# Patient Record
Sex: Male | Born: 1980 | Race: Black or African American | Hispanic: No | Marital: Single | Smoking: Current every day smoker
Health system: Southern US, Community
[De-identification: ages and names within clinical notes are randomized; demographics above are authoritative.]

## PROBLEM LIST (undated history)

## (undated) DIAGNOSIS — M549 Dorsalgia, unspecified: Secondary | ICD-10-CM

## (undated) DIAGNOSIS — G43909 Migraine, unspecified, not intractable, without status migrainosus: Secondary | ICD-10-CM

## (undated) DIAGNOSIS — I10 Essential (primary) hypertension: Secondary | ICD-10-CM

---
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
## 2004-06-15 ENCOUNTER — Emergency Department (HOSPITAL_COMMUNITY): Admission: EM | Admit: 2004-06-15 | Discharge: 2004-06-15 | Payer: Self-pay | Admitting: Family Medicine

## 2005-05-19 ENCOUNTER — Emergency Department (HOSPITAL_COMMUNITY): Admission: EM | Admit: 2005-05-19 | Discharge: 2005-05-19 | Payer: Self-pay | Admitting: Family Medicine

## 2005-08-09 ENCOUNTER — Emergency Department (HOSPITAL_COMMUNITY): Admission: EM | Admit: 2005-08-09 | Discharge: 2005-08-09 | Payer: Self-pay | Admitting: Emergency Medicine

## 2005-11-27 ENCOUNTER — Emergency Department (HOSPITAL_COMMUNITY): Admission: EM | Admit: 2005-11-27 | Discharge: 2005-11-27 | Payer: Self-pay | Admitting: Family Medicine

## 2006-03-11 ENCOUNTER — Emergency Department (HOSPITAL_COMMUNITY): Admission: EM | Admit: 2006-03-11 | Discharge: 2006-03-11 | Payer: Self-pay | Admitting: Emergency Medicine

## 2006-03-12 ENCOUNTER — Emergency Department (HOSPITAL_COMMUNITY): Admission: EM | Admit: 2006-03-12 | Discharge: 2006-03-12 | Payer: Self-pay | Admitting: Emergency Medicine
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
## 2007-01-22 ENCOUNTER — Emergency Department (HOSPITAL_COMMUNITY): Admission: EM | Admit: 2007-01-22 | Discharge: 2007-01-22 | Payer: Self-pay | Admitting: Emergency Medicine
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
## 2008-08-08 ENCOUNTER — Emergency Department (HOSPITAL_COMMUNITY): Admission: EM | Admit: 2008-08-08 | Discharge: 2008-08-08 | Payer: Self-pay | Admitting: Family Medicine

## 2008-12-20 ENCOUNTER — Emergency Department (HOSPITAL_COMMUNITY): Admission: EM | Admit: 2008-12-20 | Discharge: 2008-12-20 | Payer: Self-pay | Admitting: Family Medicine

## 2010-10-28 NOTE — H&P (Signed)
NAME:  Tracy Tate, TILL NO.:  [id number]   MEDICAL RECORD NO.:  [id number]           PATIENT TYPE:   LOCATION:                               FACILITY:  [facility]   PHYSICIAN:  Virgie Dad, MD     DATE OF BIRTH:  11/14/1980   DATE OF ADMISSION:  DATE OF DISCHARGE:                              HISTORY & PHYSICAL   No primary care [name].   CHIEF COMPLAINT:  increasing shortness of breath.   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  He is admitted for  complaints of increasing shortness of breath.  The patient stated that  for the last 4 months, he has been increasingly short of breath to the  point that he has exertional dyspnea and has not been doing work full  time.  The shortness of breath has been going on for 4 months, and had  gotten worse in the last 4 weeks.  He has exertional dyspnea and  orthopnea, denies any chest pain.  He blames his shortness of breath for  too much cigarettes.  Apparently, he smoked 4 packs a day of cigarette  for several years since age 29, and he had cut down his cigarette  smoking to 1 pack a day for the last 6 months.  He denies any loss of  weight.  Denies hemoptysis.  Denies chest pain.  No fever.   PAST MEDICAL HISTORY:  He said that he has had no serious medical  condition except for a hernia repair.   FAMILY HISTORY:  There is cancer in the family.   SOCIAL HISTORY:  Smokes 4 packs a day, cut down to two packs per day 6  months ago.  Denies any drug abuse.   MEDICATIONS:  He does not any medications, not even aspirin.  One time  he did buy Robitussin for his cough and shortness of breath thinking  that it is chronic cold.   REVIEW OF SYSTEM:  Rare headaches; nasal congestion; chronic postnasal  drip; chronic cough, nonproductive; and sometimes wheezing, which he  blames to cigarettes and allergy.  No hemoptysis.  No chest pain,  shortness of breath increased in the past 4-6 months, worse in the last  4 weeks.  No chills.  No fever.  Takes Tums or Rolaids for dyspepsia.  No diarrhea.  No vomiting.  Denies any genitourinary complaints;  although, he believes he has a prostate problem due to nocturia that he  had noticed in the past 2 years.  No complains of musculoskeletal,  except for usual arthritis and arthralgia.   PHYSICAL EXAMINATION:  VITAL SIGNS:  Blood pressure 123/90, pulse rate  of 122, respiratory rate of 20, temperature 96.8.  SKIN:  No pallor.  Skin is dry.  Not diaphoretic.  HEENT:  Pupils are equal and reacting to light.  Trace postnasal drip  with erythema of the pharynx.  Poor oral hygiene.  NECK:  Neck veins are distended.  Carotids are quiet.  The thyroid gland  is normal in size.  No nodule.  CHEST:  Bilateral basal wet rales, not cleared on coughing.  No pleural  rub.  CARDIOVASCULAR:  110 per minute.  S1, S2 normal.  No murmur.  Has an S4  and a gallop.  No pericardial rub.  ABDOMEN:  Liver edge is palpable, and nontender.  There is no ascites.  Kidneys are not palpated.  Bowel sounds normal.  No guarding.  EXTREMITIES:  Lower extremities and upper extremities are normal.  No  pulmonary osteoarthropathy of the digits.  Lower extremities, 2+ pedal  edema.  No sign of DVT.  Pedal pulses are normal.  NEUROLOGIC:  Entirely normal, alert, awake, and conversant, although  gets short of breath when making long sentences.  Cranial nerves normal.  Spinal nerves normal.  Cerebral function normal.  Cerebellar function  normal including gait.  DTRs are normal.  No Babinski.  Motor and tone  normal with no paresis.   CURRENT MEDICATION:  None.  Takes Tums every now and then, and  Robitussin every now and then.   PERTINENT LABORATORY DATA:  White count is 7.9, hemoglobin 16.6,  hematocrit 48.3, and platelet count 157,000.  Liver function tests are  all normal including ALT and AST.  Sodium 135,  potassium 4, chloride 99,  BUN 17, creatinine 1.2, blood sugar is 418, and calcium 1.01.  Hemoglobin 17.7 and hematocrit 52.  Beta natriuretic peptide 1355.  Cardiac enzymes are not elevated.  Chest x-ray showed cardiomegaly with  congestive heart failure changes.   ASSESSMENT AND PLAN:  This is a 30 year old white male, a heavy smoker  with no medical care since 1990, presenting to ED with increasing  shortness of breath and is found to be in congestive heart failure.   1. Congestive heart failure, differential diagnosis ischemic      cardiomyopathy versus pulmonary hypertension from severe chronic      obstructive pulmonary disease.  Another consideration is diabetic      cardiomyopathy, untreated diabetes, and untreated hypertension.      Plan; cardiac echo, repeat EKG, monitor cardiac enzymes, start      diuretic, ACE blocker, beta blockers, and even hydralazine for the      congestive heart failure.  Check blood pressure before giving each      dose.  We will put the patient on 2-g sodium diet and cardiac echo      has been ordered in the morning.  2. Hyperglycemia of 408.  No known history of diabetes.  The patient      is asymptomatic.  No polyuria, polydipsia, or polyphagia.  We will      put the patient on sliding scale regular insulin with sugar check      a.c. and nightly, and will need diabetic teaching, and most likely      will need ideally Lantus insulin, plus or minus oral hypoglycemic.  3. Cigarette smoking.  Smoked 4 packs per day for 20 years, cut down      to 1 pack per day for the last 6 months.  Will order NicoDerm patch      14 mg/hr patch daily, and counseling regarding quitting smoking.  4. Chronic obstructive pulmonary disease.  Most likely, the patient      has chronic obstructive pulmonary disease, will need pulmonary      function tests and further assessment of chronic obstructive      pulmonary disease.  Once congestive heart failure has resolved, we  will start albuterol 2.5 mg q.6 h. nebulizer.  We will refrain from      starting any antibiotics, pending Gram stain and culture of the      sputum.  Prognosis is guarded.  Ethics, full code.      Virgie Dad, MD  Electronically Signed     EA/MEDQ  D:  12/29/2008  T:  12/30/2008  Job:  [id number]

## 2012-01-04 ENCOUNTER — Encounter (HOSPITAL_COMMUNITY): Payer: Self-pay

## 2012-01-04 ENCOUNTER — Emergency Department (INDEPENDENT_AMBULATORY_CARE_PROVIDER_SITE_OTHER)
Admission: EM | Admit: 2012-01-04 | Discharge: 2012-01-04 | Disposition: A | Discharge status: Home / Self Care | Payer: Self-pay | Source: Home / Self Care | Attending: Emergency Medicine | Admitting: Emergency Medicine

## 2012-01-04 DIAGNOSIS — K0401 Reversible pulpitis: Secondary | ICD-10-CM

## 2012-01-04 MED ORDER — HYDROCODONE-ACETAMINOPHEN 5-325 MG PO TABS: ORAL_TABLET | ORAL | Status: AC

## 2012-01-04 MED ORDER — PENICILLIN V POTASSIUM 500 MG PO TABS: 500.0000 mg | ORAL_TABLET | Freq: Four times a day (QID) | ORAL | Status: AC

## 2012-01-04 MED ORDER — DICLOFENAC SODIUM 75 MG PO TBEC: 75.0000 mg | DELAYED_RELEASE_TABLET | Freq: Two times a day (BID) | ORAL | Status: AC

## 2012-01-04 NOTE — ED Provider Notes (Signed)
Chief Complaint  Patient presents with  . Dental Problem    History of Present Illness:   Tracy Tate is a 31 year old male who has had a one-day history of pain in the right upper third molar. He states this is been hurting him off and on for months but she's gotten worse in the last 24 hours. He notes swelling of the gingiva but no purulent drainage and he feels like there is some swelling externally, but I can see any visible external swelling. The pain is worse with chewing or swallowing. He feels feverish and chilled. Is unable to open his mouth fully. He has some headache and some high pain on that side. No coughing, wheezing, or shortness of breath. No swelling of the neck.  Review of Systems:  Other than noted above, the patient denies any of the following symptoms: Systemic:  No fever, chills, sweats or weight loss. ENT:  No headache, ear ache, sore throat, nasal congestion, facial pain, or swelling. Lymphatic:  No adenopathy. Lungs:  No coughing, wheezing or shortness of breath.  PMFSH:  Past medical history, family history, social history, meds, and allergies were reviewed.  Physical Exam:   Vital signs:  BP 128/80  Pulse 72  Temp 99.5 F (37.5 C) (Oral)  Resp 16  SpO2 100% General:  Alert, oriented, in no distress. ENT:  TMs and canals normal.  Nasal mucosa normal. Mouth exam:  The right upper third molar is markedly decayed and there is swelling of the gingiva, pain to palpation, and erythema and swelling extending to the anterior tonsillar pillar on the right. The pharynx appears normal. She's otherwise in good repair. There is no swelling of the floor the mouth. Neck:  No swelling or adenopathy. The neck is tender to touch. Lungs:  Breath sounds clear and equal bilaterally.  No wheezes, rales or rhonchi. Heart:  Regular rhythm.  No gallops or murmers. Skin:  Clear, warm and dry.  Assessment:  The encounter diagnosis was Pulpitis.  Plan:   1.  The following meds were  prescribed:   New Prescriptions   DICLOFENAC (VOLTAREN) 75 MG EC TABLET    Take 1 tablet (75 mg total) by mouth 2 (two) times daily.   HYDROCODONE-ACETAMINOPHEN (NORCO/VICODIN) 5-325 MG PER TABLET    1 to 2 tabs every 4 to 6 hours as needed for pain.   PENICILLIN V POTASSIUM (VEETID) 500 MG TABLET    Take 1 tablet (500 mg total) by mouth 4 (four) times daily.   2.  The patient was instructed in symptomatic care and handouts were given. 3.  The patient was told to return if becoming worse in any way, if no better in 3 or 4 days, and given some red flag symptoms that would indicate earlier return, especially difficulty breathing. 4.  The patient was told to follow up with a dentist as soon as possible.    Reuben Likes, MD 01/04/12 [phone]

## 2012-01-04 NOTE — ED Notes (Signed)
C/o pain in right jaw due to wisdom tooth coming in ; asking for antibiotics and pain medication; NAD at present

## 2012-12-24 ENCOUNTER — Emergency Department (INDEPENDENT_AMBULATORY_CARE_PROVIDER_SITE_OTHER)
Admission: EM | Admit: 2012-12-24 | Discharge: 2012-12-24 | Disposition: A | Discharge status: Home / Self Care | Payer: Self-pay | Source: Home / Self Care | Attending: Emergency Medicine | Admitting: Emergency Medicine

## 2012-12-24 ENCOUNTER — Encounter (HOSPITAL_COMMUNITY): Payer: Self-pay | Admitting: Emergency Medicine

## 2012-12-24 DIAGNOSIS — K047 Periapical abscess without sinus: Secondary | ICD-10-CM

## 2012-12-24 MED ORDER — HYDROCODONE-ACETAMINOPHEN 5-325 MG PO TABS
2.0000 | ORAL_TABLET | Freq: Once | ORAL | Status: AC
  Administered 2012-12-24: 2 via ORAL

## 2012-12-24 MED ORDER — HYDROCODONE-ACETAMINOPHEN 5-325 MG PO TABS
ORAL_TABLET | ORAL | Status: AC
  Filled 2012-12-24: qty 2

## 2012-12-24 MED ORDER — PENICILLIN V POTASSIUM 500 MG PO TABS: 500.0000 mg | ORAL_TABLET | Freq: Three times a day (TID) | ORAL | Status: DC

## 2012-12-24 MED ORDER — NAPROXEN 500 MG PO TABS: 500.0000 mg | ORAL_TABLET | Freq: Two times a day (BID) | ORAL | Status: DC

## 2012-12-24 MED ORDER — METRONIDAZOLE 500 MG PO TABS: 500.0000 mg | ORAL_TABLET | Freq: Three times a day (TID) | ORAL | Status: DC

## 2012-12-24 MED ORDER — OXYCODONE-ACETAMINOPHEN 5-325 MG PO TABS: ORAL_TABLET | ORAL | Status: DC

## 2012-12-24 NOTE — ED Provider Notes (Signed)
Chief Complaint:  No chief complaint on file.   History of Present Illness:   Tracy Tate is a 32 year old male who has had a 5 to six-day history of pain in his right, upper wisdom tooth. It hurts when he chews. It also hurts to open his mouth, but he is able to open fully. He has no difficulty swallowing or breathing. He feels feverish and chilled. He's had some headache, facial pain, and right ear pain. He also has some pain in the right side of his neck but no swelling. No chest pain or shortness of breath.  Review of Systems:  Other than noted above, the patient denies any of the following symptoms: Systemic:  No fever, chills,  Or sweats. ENT:  No headache, ear ache, sore throat, nasal congestion, facial pain, or swelling. Lymphatic:  No adenopathy. Lungs:  No coughing, wheezing or shortness of breath.  PMFSH:  Past medical history, family history, social history, meds, and allergies were reviewed. I have seen him in the past for the same thing.  Physical Exam:   Vital signs:  BP 137/83  Pulse 72  Temp(Src) 98 F (36.7 C) (Oral)  Resp 16  SpO2 98% General:  Alert, oriented, in no distress. ENT:  TMs and canals normal.  Nasal mucosa normal. Mouth exam:  The right, upper wisdom tooth appears intact but is free painful to palpation. There is no swelling of the gingiva, no collection of pus. No swelling externally. Otherwise his teeth all appear to be intact with the exception of a left, lower, first molar which has a large cavity. There is no swelling of the floor the mouth, no swelling of the throat or tongue. The pharynx is clear and is widely patent. Neck:  No swelling or adenopathy. Lungs:  Breath sounds clear and equal bilaterally.  No wheezes, rales or rhonchi. Heart:  Regular rhythm.  No gallops or murmers. Skin:  Clear, warm and dry.   Course in Urgent Care Center:   Given Norco 5/325, 2 capsules for pain.  Assessment:  The encounter diagnosis was Dental abscess.  He will  need to have the wisdom tooth extracted. He can probably save the molar on the left side, but will probably require root canal and crown which will be very expensive.  Plan:   1.  The following meds were prescribed:   New Prescriptions   METRONIDAZOLE (FLAGYL) 500 MG TABLET    Take 1 tablet (500 mg total) by mouth 3 (three) times daily.   NAPROXEN (NAPROSYN) 500 MG TABLET    Take 1 tablet (500 mg total) by mouth 2 (two) times daily.   OXYCODONE-ACETAMINOPHEN (PERCOCET) 5-325 MG PER TABLET    1 to 2 tablets every 6 hours as needed for pain.   PENICILLIN V POTASSIUM (VEETID) 500 MG TABLET    Take 1 tablet (500 mg total) by mouth 3 (three) times daily.   2.  The patient was instructed in symptomatic care and handouts were given.  Suggested sleeping with head of bed elevated and hot salt water mouthwash. 3.  The patient was told to return if becoming worse in any way, if no better in 3 or 4 days, and given some red flag symptoms such as difficulty swallowing or breathing that would indicate earlier return, especially difficulty breathing. 4.  The patient was told to follow up with a dentist as soon as possible.    Reuben Likes, MD 12/24/12 [phone]

## 2012-12-24 NOTE — ED Notes (Signed)
Toothache as documented on information form by patient.  Onset of pain 6 days ago

## 2015-02-03 ENCOUNTER — Emergency Department (HOSPITAL_COMMUNITY)
Admission: EM | Admit: 2015-02-03 | Discharge: 2015-02-03 | Disposition: A | Discharge status: Home / Self Care | Payer: Self-pay | Attending: Emergency Medicine | Admitting: Emergency Medicine

## 2015-02-03 ENCOUNTER — Encounter (HOSPITAL_COMMUNITY): Payer: Self-pay | Admitting: *Deleted

## 2015-02-03 DIAGNOSIS — Z79899 Other long term (current) drug therapy: Secondary | ICD-10-CM | POA: Insufficient documentation

## 2015-02-03 DIAGNOSIS — J069 Acute upper respiratory infection, unspecified: Secondary | ICD-10-CM | POA: Insufficient documentation

## 2015-02-03 DIAGNOSIS — Z720 Tobacco use: Secondary | ICD-10-CM | POA: Insufficient documentation

## 2015-02-03 DIAGNOSIS — Z792 Long term (current) use of antibiotics: Secondary | ICD-10-CM | POA: Insufficient documentation

## 2015-02-03 DIAGNOSIS — H01001 Unspecified blepharitis right upper eyelid: Secondary | ICD-10-CM | POA: Insufficient documentation

## 2015-02-03 DIAGNOSIS — Z791 Long term (current) use of non-steroidal anti-inflammatories (NSAID): Secondary | ICD-10-CM | POA: Insufficient documentation

## 2015-02-03 MED ORDER — CYCLOBENZAPRINE HCL 10 MG PO TABS: 5.0000 mg | ORAL_TABLET | Freq: Two times a day (BID) | ORAL | Status: DC | PRN

## 2015-02-03 MED ORDER — BENZONATATE 100 MG PO CAPS: 100.0000 mg | ORAL_CAPSULE | Freq: Three times a day (TID) | ORAL | Status: DC

## 2015-02-03 MED ORDER — ERYTHROMYCIN 5 MG/GM OP OINT: TOPICAL_OINTMENT | OPHTHALMIC | Status: DC

## 2015-02-03 MED ORDER — AZITHROMYCIN 250 MG PO TABS: 250.0000 mg | ORAL_TABLET | Freq: Every day | ORAL | Status: DC

## 2015-02-03 NOTE — Discharge Instructions (Signed)
Blepharitis Blepharitis is redness, soreness, and swelling (inflammation) of one or both eyelids. It may be caused by an allergic reaction or a bacterial infection. Blepharitis may also be associated with reddened, scaly skin (seborrhea) of the scalp and eyebrows. While you sleep, eye discharge may cause your eyelashes to stick together. Your eyelids may itch, burn, swell, and may lose their lashes. These will grow back. Your eyes may become sensitive. Blepharitis may recur and need repeated treatment. If this is the case, you may require further evaluation by an eye specialist (ophthalmologist). HOME CARE INSTRUCTIONS   Keep your hands clean.  Use a clean towel each time you dry your eyelids. Do not use this towel to clean other areas. Do not share a towel or makeup with anyone.  Wash your eyelids with warm water or warm water mixed with a small amount of baby shampoo. Do this twice a day or as often as needed.  Wash your face and eyebrows at least once a day.  Use warm compresses 2 times a day for 10 minutes at a time, or as directed by your caregiver.  Apply antibiotic ointment as directed by your caregiver.  Avoid rubbing your eyes.  Avoid wearing makeup until you get better.  Follow up with your caregiver as directed. SEEK IMMEDIATE MEDICAL CARE IF:   You have pain, redness, or swelling that gets worse or spreads to other parts of your face.  Your vision changes, or you have pain when looking at lights or moving objects.  You have a fever.  Your symptoms continue for longer than 2 to 4 days or become worse. MAKE SURE YOU:  Upper Respiratory Infection, Adult An upper respiratory infection (URI) is also sometimes known as the common cold. The upper respiratory tract includes the nose, sinuses, throat, trachea, and bronchi. Bronchi are the airways leading to the lungs. Most people improve within 1 week, but symptoms can last up to 2 weeks. A residual cough may last even longer.    CAUSES Many different viruses can infect the tissues lining the upper respiratory tract. The tissues become irritated and inflamed and often become very moist. Mucus production is also common. A cold is contagious. You can easily spread the virus to others by oral contact. This includes kissing, sharing a glass, coughing, or sneezing. Touching your mouth or nose and then touching a surface, which is then touched by another person, can also spread the virus. SYMPTOMS  Symptoms typically develop 1 to 3 days after you come in contact with a cold virus. Symptoms vary from person to person. They may include:  Runny nose.  Sneezing.  Nasal congestion.  Sinus irritation.  Sore throat.  Loss of voice (laryngitis).  Cough.  Fatigue.  Muscle aches.  Loss of appetite.  Headache.  Low-grade fever. DIAGNOSIS  You might diagnose your own cold based on familiar symptoms, since most people get a cold 2 to 3 times a year. Your caregiver can confirm this based on your exam. Most importantly, your caregiver can check that your symptoms are not due to another disease such as strep throat, sinusitis, pneumonia, asthma, or epiglottitis. Blood tests, throat tests, and X-rays are not necessary to diagnose a common cold, but they may sometimes be helpful in excluding other more serious diseases. Your caregiver will decide if any further tests are required. RISKS AND COMPLICATIONS  You may be at risk for a more severe case of the common cold if you smoke cigarettes, have chronic heart disease (  such as heart failure) or lung disease (such as asthma), or if you have a weakened immune system. The very young and very old are also at risk for more serious infections. Bacterial sinusitis, middle ear infections, and bacterial pneumonia can complicate the common cold. The common cold can worsen asthma and chronic obstructive pulmonary disease (COPD). Sometimes, these complications can require emergency medical care  and may be life-threatening. PREVENTION  The best way to protect against getting a cold is to practice good hygiene. Avoid oral or hand contact with people with cold symptoms. Wash your hands often if contact occurs. There is no clear evidence that vitamin C, vitamin E, echinacea, or exercise reduces the chance of developing a cold. However, it is always recommended to get plenty of rest and practice good nutrition. TREATMENT  Treatment is directed at relieving symptoms. There is no cure. Antibiotics are not effective, because the infection is caused by a virus, not by bacteria. Treatment may include:  Increased fluid intake. Sports drinks offer valuable electrolytes, sugars, and fluids.  Breathing heated mist or steam (vaporizer or shower).  Eating chicken soup or other clear broths, and maintaining good nutrition.  Getting plenty of rest.  Using gargles or lozenges for comfort.  Controlling fevers with ibuprofen or acetaminophen as directed by your caregiver.  Increasing usage of your inhaler if you have asthma. Zinc gel and zinc lozenges, taken in the first 24 hours of the common cold, can shorten the duration and lessen the severity of symptoms. Pain medicines may help with fever, muscle aches, and throat pain. A variety of non-prescription medicines are available to treat congestion and runny nose. Your caregiver can make recommendations and may suggest nasal or lung inhalers for other symptoms.  HOME CARE INSTRUCTIONS   Only take over-the-counter or prescription medicines for pain, discomfort, or fever as directed by your caregiver.  Use a warm mist humidifier or inhale steam from a shower to increase air moisture. This may keep secretions moist and make it easier to breathe.  Drink enough water and fluids to keep your urine clear or pale yellow.  Rest as needed.  Return to work when your temperature has returned to normal or as your caregiver advises. You may need to stay home  longer to avoid infecting others. You can also use a face mask and careful hand washing to prevent spread of the virus. SEEK MEDICAL CARE IF:   After the first few days, you feel you are getting worse rather than better.  You need your caregiver's advice about medicines to control symptoms.  You develop chills, worsening shortness of breath, or brown or red sputum. These may be signs of pneumonia.  You develop yellow or brown nasal discharge or pain in the face, especially when you bend forward. These may be signs of sinusitis.  You develop a fever, swollen neck glands, pain with swallowing, or white areas in the back of your throat. These may be signs of strep throat. SEEK IMMEDIATE MEDICAL CARE IF:   You have a fever.  You develop severe or persistent headache, ear pain, sinus pain, or chest pain.  You develop wheezing, a prolonged cough, cough up blood, or have a change in your usual mucus (if you have chronic lung disease).  You develop sore muscles or a stiff neck. Document Released: 11/22/2000 Document Revised: 08/21/2011 Document Reviewed: 09/03/2013 [facility] Patient Information 2015 [place], [place]. This information is not intended to replace advice given to you by your health care provider.  Make sure you discuss any questions you have with your health care provider.   Understand these instructions.  Will watch your condition.  Will get help right away if you are not doing well or get worse. Document Released: 05/26/2000 Document Revised: 08/21/2011 Document Reviewed: 07/06/2010 [facility] Patient Information 2015 [place], [place]. This information is not intended to replace advice given to you by your health care provider. Make sure you discuss any questions you have with your health care provider.

## 2015-02-03 NOTE — ED Notes (Signed)
Pt presents to day because he back pain and cough that started on Monday . Pt also reports on Tuesday his Rt eye lid was swollen and a cough developed .

## 2015-02-03 NOTE — ED Provider Notes (Signed)
CSN: [id number]     Arrival date & time 02/03/15  0927 History  This chart was scribed for non-physician practitioner, Dorthula Matas, working with Gwyneth Sprout, MD by Freida Busman, ED Scribe. This patient was seen in room TR04C/TR04C and the patient's care was started at 10:07 AM.     Chief Complaint  Patient presents with  . Influenza  . Cough  . Eye Problem    The history is provided by the patient. No language interpreter was used.     HPI Comments:  Tracy Tate is a 34 y.o. male who presents to the Emergency Department with multiple complaints. He complains of generalized myalgia,and moderate back pain he describes as spasms. He reports associated swelling of his right eyelid and mild pain with movement of the eye, subjective fever and cough. His symptoms started ~3 days ago. He denies sick contacts. He has taken Robitussin without relief. He also denies vomiting, diarrhea, and abdominal pain.   PCP: No PCP Per Patient Pulse 66, temperature 98 F (36.7 C), temperature source Oral, resp. rate 16, height 6' (1.829 m), weight 225 lb (102.059 kg), SpO2 98 %.  The patient denies diaphoresis, fever, headache, weakness (general or focal), confusion, change of vision,  neck pain, dysphagia, aphagia, chest pain, shortness of breath,  abdominal pains, nausea, vomiting, diarrhea, lower extremity swelling, rash.  History reviewed. No pertinent past medical history. History reviewed. No pertinent past surgical history. History reviewed. No pertinent family history. Social History  Substance Use Topics  . Smoking status: Current Every Day Smoker -- 1.00 packs/day    Types: Cigarettes  . Smokeless tobacco: None  . Alcohol Use: Yes    Review of Systems  Constitutional: Positive for fever.  Eyes: Positive for pain (and mild swelling).  Respiratory: Positive for cough.   Gastrointestinal: Negative for vomiting, abdominal pain and diarrhea.  Musculoskeletal: Positive for myalgias  and back pain.   Allergies  Review of patient's allergies indicates no known allergies.  Home Medications   Prior to Admission medications   Medication Sig Start Date End Date Taking? Authorizing Provider  azithromycin (ZITHROMAX) 250 MG tablet Take 1 tablet (250 mg total) by mouth daily. Take first 2 tablets together, then 1 every day until finished. 02/03/15   Marsia Cino Neva Seat, PA-C  benzonatate (TESSALON) 100 MG capsule Take 1 capsule (100 mg total) by mouth every 8 (eight) hours. 02/03/15   Shray Hunley Neva Seat, PA-C  cyclobenzaprine (FLEXERIL) 10 MG tablet Take 0.5-1 tablets (5-10 mg total) by mouth 2 (two) times daily as needed for muscle spasms. 02/03/15   Marlon Pel, PA-C  erythromycin ophthalmic ointment Place a 1/2 inch ribbon of ointment into the lower eyelid BID for 7 days. 02/03/15   Doratha Mcswain Neva Seat, PA-C  metroNIDAZOLE (FLAGYL) 500 MG tablet Take 1 tablet (500 mg total) by mouth 3 (three) times daily. 12/24/12   Reuben Likes, MD  naproxen (NAPROSYN) 500 MG tablet Take 1 tablet (500 mg total) by mouth 2 (two) times daily. 12/24/12   Reuben Likes, MD  oxyCODONE-acetaminophen (PERCOCET) 5-325 MG per tablet 1 to 2 tablets every 6 hours as needed for pain. 12/24/12   Reuben Likes, MD  penicillin v potassium (VEETID) 500 MG tablet Take 1 tablet (500 mg total) by mouth 3 (three) times daily. 12/24/12   Reuben Likes, MD   Pulse 66  Temp(Src) 98 F (36.7 C) (Oral)  Resp 16  Ht 6' (1.829 m)  Wt 225 lb (102.059 kg)  BMI  30.51 kg/m2  SpO2 98% Physical Exam  Constitutional: He is oriented to person, place, and time. He appears well-developed and well-nourished. No distress.  HENT:  Head: Normocephalic and atraumatic.  Eyes: Conjunctivae and EOM are normal. Pupils are equal, round, and reactive to light. Right eye exhibits discharge (swelling to medial upper eyelid. mildly tender to palpation). Right conjunctiva is not injected. Left conjunctiva is not injected.  No pain with EOMI's.   Neck: Normal range of motion.  Cardiovascular: Normal rate.   Pulmonary/Chest: Effort normal and breath sounds normal. He has no decreased breath sounds. He has no wheezes. He has no rhonchi.  + coughing during exam  Musculoskeletal: Normal range of motion.       Thoracic back: Normal.       Lumbar back: Normal.  Neurological: He is alert and oriented to person, place, and time.  Skin: Skin is warm and dry.  Psychiatric: He has a normal mood and affect. His behavior is normal.  Nursing note and vitals reviewed.   ED Course  Procedures   DIAGNOSTIC STUDIES:  COORDINATION OF CARE:  10:14 AM Will discharge with prescription for antibiotic ointment for the eye, oral antibiotic for his URI and eyelid as well as tessalon pearls. Advised pt to use ibuprofen for his back pain and will rx muscle relaxers.  Discussed treatment plan with pt at bedside and pt agreed to plan. Pt is welling appearing, no s/sx of orbital cellulitis or deeper infection.   Labs Review Labs Reviewed - No data to display  Imaging Review No results found. I have personally reviewed and evaluated these images and lab results as part of my medical decision-making.   EKG Interpretation None      MDM   Final diagnoses:  Blepharitis of right upper eyelid  URI (upper respiratory infection)    Medications - No data to display  34 y.o.Tracy Tate's evaluation in the Emergency Department is complete. It has been determined that no acute conditions requiring further emergency intervention are present at this time. The patient/guardian have been advised of the diagnosis and plan. We have discussed signs and symptoms that warrant return to the ED, such as changes or worsening in symptoms.  Vital signs are stable at discharge. Filed Vitals:   02/03/15 0951  Pulse: 66  Temp: 98 F (36.7 C)  Resp: 16    Patient/guardian has voiced understanding and agreed to follow-up with the PCP or specialist.   I  personally performed the services described in this documentation, which was scribed in my presence. The recorded information has been reviewed and is accurate.    Marlon Pel, PA-C 02/03/15 1026  Gwyneth Sprout, MD 02/03/15 2228

## 2015-03-29 ENCOUNTER — Emergency Department (INDEPENDENT_AMBULATORY_CARE_PROVIDER_SITE_OTHER)
Admission: EM | Admit: 2015-03-29 | Discharge: 2015-03-29 | Disposition: A | Discharge status: Home / Self Care | Payer: Self-pay | Source: Home / Self Care | Attending: Family Medicine | Admitting: Family Medicine

## 2015-03-29 ENCOUNTER — Encounter (HOSPITAL_COMMUNITY): Payer: Self-pay | Admitting: *Deleted

## 2015-03-29 DIAGNOSIS — M545 Low back pain, unspecified: Secondary | ICD-10-CM

## 2015-03-29 MED ORDER — DICLOFENAC POTASSIUM 50 MG PO TABS: 50.0000 mg | ORAL_TABLET | Freq: Three times a day (TID) | ORAL | Status: DC

## 2015-03-29 MED ORDER — KETOROLAC TROMETHAMINE 30 MG/ML IJ SOLN
INTRAMUSCULAR | Status: AC
  Filled 2015-03-29: qty 1

## 2015-03-29 MED ORDER — CYCLOBENZAPRINE HCL 5 MG PO TABS: 5.0000 mg | ORAL_TABLET | Freq: Three times a day (TID) | ORAL | Status: DC | PRN

## 2015-03-29 MED ORDER — KETOROLAC TROMETHAMINE 30 MG/ML IJ SOLN
30.0000 mg | Freq: Once | INTRAMUSCULAR | Status: AC
  Administered 2015-03-29: 30 mg via INTRAMUSCULAR

## 2015-03-29 NOTE — ED Notes (Signed)
Pt  Reports       Symptoms   Of  Back  Pain  -   He  Injured  His  Back     Back  5  Months  Ago        And  It  Has  Been  Getting  Worse  Over  The  Last    Several  Days            He  States  The  Symptoms  Are  Not  releived  By OTC  meds

## 2015-03-29 NOTE — Discharge Instructions (Signed)
Use medicine and see orthopedist if further back problems

## 2015-03-29 NOTE — ED Provider Notes (Signed)
CSN: [id number]     Arrival date & time 03/29/15  1715 History   First MD Initiated Contact with Patient 03/29/15 1736     Chief Complaint  Patient presents with  . Back Pain   (Consider location/radiation/quality/duration/timing/severity/associated sxs/prior Treatment) Patient is a 34 y.o. male presenting with back pain. The history is provided by the patient.  Back Pain Location:  Lumbar spine Quality:  Shooting and stiffness Stiffness is present:  In the morning Radiates to:  Does not radiate Pain severity:  Moderate Onset quality:  Sudden Duration:  2 days Timing:  Intermittent Progression:  Waxing and waning Chronicity:  Chronic (problem for 8 months temporary but recurrent) Context: lifting heavy objects   Relieved by:  Bed rest Worsened by:  Bending   History reviewed. No pertinent past medical history. History reviewed. No pertinent past surgical history. History reviewed. No pertinent family history. Social History  Substance Use Topics  . Smoking status: Current Every Day Smoker -- 1.00 packs/day    Types: Cigarettes  . Smokeless tobacco: None  . Alcohol Use: Yes    Review of Systems  Constitutional: Negative.   Cardiovascular: Negative.   Genitourinary: Negative.   Musculoskeletal: Positive for back pain. Negative for joint swelling and gait problem.  Skin: Negative.   Neurological: Negative.   All other systems reviewed and are negative.   Allergies  Review of patient's allergies indicates no known allergies.  Home Medications   Prior to Admission medications   Medication Sig Start Date End Date Taking? Authorizing Provider  azithromycin (ZITHROMAX) 250 MG tablet Take 1 tablet (250 mg total) by mouth daily. Take first 2 tablets together, then 1 every day until finished. 02/03/15   Tiffany Neva Seat, PA-C  benzonatate (TESSALON) 100 MG capsule Take 1 capsule (100 mg total) by mouth every 8 (eight) hours. 02/03/15   Tiffany Neva Seat, PA-C  cyclobenzaprine  (FLEXERIL) 5 MG tablet Take 1 tablet (5 mg total) by mouth 3 (three) times daily as needed for muscle spasms. 03/29/15   Linna Hoff, MD  diclofenac (CATAFLAM) 50 MG tablet Take 1 tablet (50 mg total) by mouth 3 (three) times daily. For back pain 03/29/15   Linna Hoff, MD  erythromycin ophthalmic ointment Place a 1/2 inch ribbon of ointment into the lower eyelid BID for 7 days. 02/03/15   Tiffany Neva Seat, PA-C  metroNIDAZOLE (FLAGYL) 500 MG tablet Take 1 tablet (500 mg total) by mouth 3 (three) times daily. 12/24/12   Reuben Likes, MD  naproxen (NAPROSYN) 500 MG tablet Take 1 tablet (500 mg total) by mouth 2 (two) times daily. 12/24/12   Reuben Likes, MD  oxyCODONE-acetaminophen (PERCOCET) 5-325 MG per tablet 1 to 2 tablets every 6 hours as needed for pain. 12/24/12   Reuben Likes, MD  penicillin v potassium (VEETID) 500 MG tablet Take 1 tablet (500 mg total) by mouth 3 (three) times daily. 12/24/12   Reuben Likes, MD   Meds Ordered and Administered this Visit   Medications  ketorolac (TORADOL) 30 MG/ML injection 30 mg (30 mg Intramuscular Given 03/29/15 1751)    BP 143/84 mmHg  Pulse 96  Temp(Src) 98.7 F (37.1 C) (Oral)  Resp 16  SpO2 96% No data found.   Physical Exam  Constitutional: He is oriented to person, place, and time. He appears well-developed and well-nourished. No distress.  Musculoskeletal: He exhibits tenderness.       Lumbar back: He exhibits decreased range of motion, tenderness, pain and spasm.  He exhibits no swelling, no edema, no deformity and normal pulse.  Neurological: He is alert and oriented to person, place, and time.  Skin: Skin is warm and dry.  Nursing note and vitals reviewed.   ED Course  Procedures (including critical care time)  Labs Review Labs Reviewed - No data to display  Imaging Review No results found.   Visual Acuity Review  Right Eye Distance:   Left Eye Distance:   Bilateral Distance:    Right Eye Near:   Left Eye  Near:    Bilateral Near:         MDM   1. Bilateral low back pain without sciatica    rx for flexeril and diclofenac and ortho referral, oow note for 2d.    Linna HoffJames D Zenita Kister, MD 03/29/15 2122

## 2015-05-03 ENCOUNTER — Encounter (HOSPITAL_COMMUNITY): Payer: Self-pay | Admitting: Emergency Medicine

## 2015-05-03 ENCOUNTER — Emergency Department (HOSPITAL_COMMUNITY)
Admission: EM | Admit: 2015-05-03 | Discharge: 2015-05-03 | Disposition: A | Discharge status: Home / Self Care | Payer: Self-pay | Attending: Emergency Medicine | Admitting: Emergency Medicine

## 2015-05-03 DIAGNOSIS — M545 Low back pain, unspecified: Secondary | ICD-10-CM

## 2015-05-03 DIAGNOSIS — G8929 Other chronic pain: Secondary | ICD-10-CM | POA: Insufficient documentation

## 2015-05-03 DIAGNOSIS — F17210 Nicotine dependence, cigarettes, uncomplicated: Secondary | ICD-10-CM | POA: Insufficient documentation

## 2015-05-03 MED ORDER — BUPIVACAINE HCL 0.5 % IJ SOLN: 50.0000 mL | Freq: Once | INTRAMUSCULAR | Status: DC

## 2015-05-03 MED ORDER — BUPIVACAINE HCL (PF) 0.5 % IJ SOLN
20.0000 mL | Freq: Once | INTRAMUSCULAR | Status: AC
  Administered 2015-05-03: 20 mL
  Filled 2015-05-03: qty 20

## 2015-05-03 NOTE — ED Notes (Signed)
MD at the bedside  

## 2015-05-03 NOTE — Discharge Instructions (Signed)
Back Exercises The following exercises strengthen the muscles that help to support the back. They also help to keep the lower back flexible. Doing these exercises can help to prevent back pain or lessen existing pain. If you have back pain or discomfort, try doing these exercises 2-3 times each day or as told by your health care provider. When the pain goes away, do them once each day, but increase the number of times that you repeat the steps for each exercise (do more repetitions). If you do not have back pain or discomfort, do these exercises once each day or as told by your health care provider. EXERCISES Single Knee to Chest Repeat these steps 3-5 times for each leg:  Lie on your back on a firm bed or the floor with your legs extended.  Bring one knee to your chest. Your other leg should stay extended and in contact with the floor.  Hold your knee in place by grabbing your knee or thigh.  Pull on your knee until you feel a gentle stretch in your lower back.  Hold the stretch for 10-30 seconds.  Slowly release and straighten your leg. Pelvic Tilt Repeat these steps 5-10 times:  Lie on your back on a firm bed or the floor with your legs extended.  Bend your knees so they are pointing toward the ceiling and your feet are flat on the floor.  Tighten your lower abdominal muscles to press your lower back against the floor. This motion will tilt your pelvis so your tailbone points up toward the ceiling instead of pointing to your feet or the floor.  With gentle tension and even breathing, hold this position for 5-10 seconds. Cat-Cow Repeat these steps until your lower back becomes more flexible:  Get into a hands-and-knees position on a firm surface. Keep your hands under your shoulders, and keep your knees under your hips. You may place padding under your knees for comfort.  Let your head hang down, and point your tailbone toward the floor so your lower back becomes rounded like the  back of a cat.  Hold this position for 5 seconds.  Slowly lift your head and point your tailbone up toward the ceiling so your back forms a sagging arch like the back of a cow.  Hold this position for 5 seconds. Press-Ups Repeat these steps 5-10 times:  Lie on your abdomen (face-down) on the floor.  Place your palms near your head, about shoulder-width apart.  While you keep your back as relaxed as possible and keep your hips on the floor, slowly straighten your arms to raise the top half of your body and lift your shoulders. Do not use your back muscles to raise your upper torso. You may adjust the placement of your hands to make yourself more comfortable.  Hold this position for 5 seconds while you keep your back relaxed.  Slowly return to lying flat on the floor. Bridges Repeat these steps 10 times:  Lie on your back on a firm surface.  Bend your knees so they are pointing toward the ceiling and your feet are flat on the floor.  Tighten your buttocks muscles and lift your buttocks off of the floor until your waist is at almost the same height as your knees. You should feel the muscles working in your buttocks and the back of your thighs. If you do not feel these muscles, slide your feet 1-2 inches farther away from your buttocks.  Hold this position for 3-5  seconds.  Slowly lower your hips to the starting position, and allow your buttocks muscles to relax completely. If this exercise is too easy, try doing it with your arms crossed over your chest. Abdominal Crunches Repeat these steps 5-10 times:  Lie on your back on a firm bed or the floor with your legs extended.  Bend your knees so they are pointing toward the ceiling and your feet are flat on the floor.  Cross your arms over your chest.  Tip your chin slightly toward your chest without bending your neck.  Tighten your abdominal muscles and slowly raise your trunk (torso) high enough to lift your shoulder blades a  tiny bit off of the floor. Avoid raising your torso higher than that, because it can put too much stress on your low back and it does not help to strengthen your abdominal muscles.  Slowly return to your starting position. Back Lifts Repeat these steps 5-10 times: 1. Lie on your abdomen (face-down) with your arms at your sides, and rest your forehead on the floor. 2. Tighten the muscles in your legs and your buttocks. 3. Slowly lift your chest off of the floor while you keep your hips pressed to the floor. Keep the back of your head in line with the curve in your back. Your eyes should be looking at the floor. 4. Hold this position for 3-5 seconds. 5. Slowly return to your starting position. SEEK MEDICAL CARE IF:  Your back pain or discomfort gets much worse when you do an exercise.  Your back pain or discomfort does not lessen within 2 hours after you exercise. If you have any of these problems, stop doing these exercises right away. Do not do them again unless your health care provider says that you can. SEEK IMMEDIATE MEDICAL CARE IF:  You develop sudden, severe back pain. If this happens, stop doing the exercises right away. Do not do them again unless your health care provider says that you can.   This information is not intended to replace advice given to you by your health care provider. Make sure you discuss any questions you have with your health care provider.   Document Released: 07/06/2004 Document Revised: 02/17/2015 Document Reviewed: 07/23/2014 Elsevier Interactive Patient Education 2016 [employer] Back Injury Prevention Back injuries can be very painful. They can also be difficult to heal. After having one back injury, you are more likely to injure your back again. It is important to learn how to avoid injuring or re-injuring your back. The following tips can help you to prevent a back injury. WHAT SHOULD I KNOW ABOUT PHYSICAL FITNESS?  Exercise for 30 minutes per day  on most days of the week or as directed by your health care provider. Make sure to:  Do aerobic exercises, such as walking, jogging, biking, or swimming.  Do exercises that increase balance and strength, such as tai chi and yoga. These can decrease your risk of falling and injuring your back.  Do stretching exercises to help with flexibility.  Try to develop strong abdominal muscles. Your abdominal muscles provide a lot of the support that is needed by your back.  Maintain a healthy weight. This helps to decrease your risk of a back injury. WHAT SHOULD I KNOW ABOUT MY DIET?  Talk with your health care provider about your overall diet. Take supplements and vitamins only as directed by your health care provider.  Talk with your health care provider about how much calcium and vitamin   D you need each day. These nutrients help to prevent weakening of the bones (osteoporosis). Osteoporosis can cause broken (fractured) bones, which lead to back pain.  Include good sources of calcium in your diet, such as dairy products, green leafy vegetables, and products that have had calcium added to them (fortified).  Include good sources of vitamin D in your diet, such as milk and foods that are fortified with vitamin D. WHAT SHOULD I KNOW ABOUT MY POSTURE?  Sit up straight and stand up straight. Avoid leaning forward when you sit or hunching over when you stand.  Choose chairs that have good low-back (lumbar) support.  If you work at a desk, sit close to it so you do not need to lean over. Keep your chin tucked in. Keep your neck drawn back, and keep your elbows bent at a right angle. Your arms should look like the letter "L."  Sit high and close to the steering wheel when you drive. Add a lumbar support to your car seat, if needed.  Avoid sitting or standing in one position for very long. Take breaks to get up, stretch, and walk around at least one time every hour. Take breaks every hour if you are  driving for long periods of time.  Sleep on your side with your knees slightly bent, or sleep on your back with a pillow under your knees. Do not lie on the front of your body to sleep. WHAT SHOULD I KNOW ABOUT LIFTING, TWISTING, AND REACHING? Lifting and Heavy Lifting  Avoid heavy lifting, especially repetitive heavy lifting. If you must do heavy lifting:  Stretch before lifting.  Work slowly.  Rest between lifts.  Use a tool such as a cart or a dolly to move objects if one is available.  Make several small trips instead of carrying one heavy load.  Ask for help when you need it, especially when moving big objects.  Follow these steps when lifting:  Stand with your feet shoulder-width apart.  Get as close to the object as you can. Do not try to pick up a heavy object that is far from your body.  Use handles or lifting straps if they are available.  Bend at your knees. Squat down, but keep your heels off the floor.  Keep your shoulders pulled back, your chin tucked in, and your back straight.  Lift the object slowly while you tighten the muscles in your legs, abdomen, and buttocks. Keep the object as close to the center of your body as possible.  Follow these steps when putting down a heavy load:  Stand with your feet shoulder-width apart.  Lower the object slowly while you tighten the muscles in your legs, abdomen, and buttocks. Keep the object as close to the center of your body as possible.  Keep your shoulders pulled back, your chin tucked in, and your back straight.  Bend at your knees. Squat down, but keep your heels off the floor.  Use handles or lifting straps if they are available. Twisting and Reaching  Avoid lifting heavy objects above your waist.  Do not twist at your waist while you are lifting or carrying a load. If you need to turn, move your feet.  Do not bend over without bending at your knees.  Avoid reaching over your head, across a table, or  for an object on a high surface. WHAT ARE SOME OTHER TIPS?  Avoid wet floors and icy ground. Keep sidewalks clear of ice to prevent falls.    falls.  Do not sleep on a mattress that is too soft or too hard.  Keep items that are used frequently within easy reach.  Put heavier objects on shelves at waist level, and put lighter objects on lower or higher shelves.  Find ways to decrease your stress, such as exercise, massage, or relaxation techniques. Stress can build up in your muscles. Tense muscles are more vulnerable to injury.  Talk with your health care provider if you feel anxious or depressed. These conditions can make back pain worse.  Wear flat heel shoes with cushioned soles.  Avoid sudden movements.  Use both shoulder straps when carrying a backpack.  Do not use any tobacco products, including cigarettes, chewing tobacco, or electronic cigarettes. If you need help quitting, ask your health care provider.   This information is not intended to replace advice given to you by your health care provider. Make sure you discuss any questions you have with your health care provider.   Document Released: 07/06/2004 Document Revised: 10/13/2014 Document Reviewed: 06/02/2014 Elsevier Interactive Patient Education [employer].

## 2015-05-03 NOTE — ED Notes (Signed)
Per patient has injured back a few months ago.  Has been here several times for the same thing.  States I just need a couple days off work to rest my back.

## 2015-05-03 NOTE — ED Provider Notes (Signed)
CSN: [id number]     Arrival date & time 05/03/15  [id number] History   First MD Initiated Contact with Patient 05/03/15 [phone]     Chief Complaint  Patient presents with  . Back Pain     (Consider location/radiation/quality/duration/timing/severity/associated sxs/prior Treatment) Patient is a 34 y.o. male presenting with back pain. The history is provided by the patient.  Back Pain Location:  Lumbar spine Quality:  Aching Radiates to:  Does not radiate Pain severity:  Moderate Pain is:  Same all the time Onset quality:  Gradual Duration:  6 months Timing:  Intermittent Progression:  Waxing and waning Chronicity:  Chronic Context: occupational injury (at work)   Relieved by:  Nothing Worsened by:  Nothing tried Ineffective treatments:  None tried Associated symptoms: no bladder incontinence, no bowel incontinence, no fever and no weight loss     History reviewed. No pertinent past medical history. History reviewed. No pertinent past surgical history. No family history on file. Social History  Substance Use Topics  . Smoking status: Current Every Day Smoker -- 1.00 packs/day    Types: Cigarettes  . Smokeless tobacco: None  . Alcohol Use: Yes    Review of Systems  Constitutional: Negative for fever and weight loss.  Gastrointestinal: Negative for bowel incontinence.  Genitourinary: Negative for bladder incontinence.  Musculoskeletal: Positive for back pain.  All other systems reviewed and are negative.     Allergies  Review of patient's allergies indicates no known allergies.  Home Medications   Prior to Admission medications   Medication Sig Start Date End Date Taking? Authorizing Provider  azithromycin (ZITHROMAX) 250 MG tablet Take 1 tablet (250 mg total) by mouth daily. Take first 2 tablets together, then 1 every day until finished. Patient not taking: Reported on 05/03/2015 02/03/15   Marlon Peliffany Greene, PA-C  benzonatate (TESSALON) 100 MG capsule Take 1 capsule (100  mg total) by mouth every 8 (eight) hours. Patient not taking: Reported on 05/03/2015 02/03/15   Marlon Peliffany Greene, PA-C  cyclobenzaprine (FLEXERIL) 5 MG tablet Take 1 tablet (5 mg total) by mouth 3 (three) times daily as needed for muscle spasms. Patient not taking: Reported on 05/03/2015 03/29/15   Linna HoffJames D Kindl, MD  diclofenac (CATAFLAM) 50 MG tablet Take 1 tablet (50 mg total) by mouth 3 (three) times daily. For back pain Patient not taking: Reported on 05/03/2015 03/29/15   Linna HoffJames D Kindl, MD  erythromycin ophthalmic ointment Place a 1/2 inch ribbon of ointment into the lower eyelid BID for 7 days. Patient not taking: Reported on 05/03/2015 02/03/15   Marlon Peliffany Greene, PA-C  metroNIDAZOLE (FLAGYL) 500 MG tablet Take 1 tablet (500 mg total) by mouth 3 (three) times daily. Patient not taking: Reported on 05/03/2015 12/24/12   Reuben Likesavid C Keller, MD  naproxen (NAPROSYN) 500 MG tablet Take 1 tablet (500 mg total) by mouth 2 (two) times daily. Patient not taking: Reported on 05/03/2015 12/24/12   Reuben Likesavid C Keller, MD  oxyCODONE-acetaminophen (PERCOCET) 5-325 MG per tablet 1 to 2 tablets every 6 hours as needed for pain. Patient not taking: Reported on 05/03/2015 12/24/12   Reuben Likesavid C Keller, MD  penicillin v potassium (VEETID) 500 MG tablet Take 1 tablet (500 mg total) by mouth 3 (three) times daily. Patient not taking: Reported on 05/03/2015 12/24/12   Reuben Likesavid C Keller, MD   BP 195/114 mmHg  Pulse 78  Temp(Src) 98.2 F (36.8 C) (Oral)  Resp 16  Ht 6\' 1"  (1.854 m)  Wt 230 lb (104.327 kg)  BMI 30.35 kg/m2  SpO2 95% Physical Exam  Constitutional: He is oriented to person, place, and time. He appears well-developed and well-nourished. No distress.  HENT:  Head: Normocephalic and atraumatic.  Eyes: Conjunctivae are normal.  Neck: Neck supple. No tracheal deviation present.  Cardiovascular: Normal rate and regular rhythm.   Pulmonary/Chest: Effort normal. No respiratory distress.  Abdominal: Soft. He exhibits  no distension.  Musculoskeletal:       Lumbar back: He exhibits tenderness (overlying bilateral paraspinal musculature).  Neurological: He is alert and oriented to person, place, and time.  Skin: Skin is warm and dry.  Psychiatric: He has a normal mood and affect.    ED Course  Procedures (including critical care time)  Procedure Note: Trigger Point Injection for Myofascial pain  Performed by Dr. Clydene Pugh Indication: muscle/myofascial pain Muscle body and tendon sheath of the right paraspinal muscle(s) were injected with 0.5% bupivacaine under sterile technique for release of muscle spasm/pain. Patient tolerated well with immediate improvement of symptoms and no immediate complications following procedure.  CPT Code:   1 or 2 muscle bodies: 20552 3 or more: 16109   Labs Review Labs Reviewed - No data to display  Imaging Review No results found. I have personally reviewed and evaluated these images and lab results as part of my medical decision-making.   EKG Interpretation None      MDM   Final diagnoses:  Bilateral low back pain without sciatica    34 y.o. male presents with chronic bilateral lower back pain over the last 6 months. He has multiple ED visits with similar complaints. He is concerned that each time he comes here only gets a few days off of work and needs a longer time out. Over the same course of time he has made no attempt to establish a primary care provider. No red flag symptoms of fever, weight loss, saddle anesthesia, weakness fecal/urinary incontinence or urinary retention. Offered trigger point injections for bilateral lower back muscle spasm, was able to tolerate 1 but not both sides. Patient needs to establish primary care in the area and was provided contact information to do so.    Lyndal Pulley, MD 05/03/15 [phone]

## 2015-10-18 ENCOUNTER — Ambulatory Visit (HOSPITAL_COMMUNITY)
Admission: EM | Admit: 2015-10-18 | Discharge: 2015-10-18 | Disposition: A | Discharge status: Home / Self Care | Payer: Self-pay | Attending: Emergency Medicine | Admitting: Emergency Medicine

## 2015-10-18 ENCOUNTER — Encounter (HOSPITAL_COMMUNITY): Payer: Self-pay | Admitting: Emergency Medicine

## 2015-10-18 DIAGNOSIS — T148 Other injury of unspecified body region: Secondary | ICD-10-CM

## 2015-10-18 DIAGNOSIS — T148XXA Other injury of unspecified body region, initial encounter: Secondary | ICD-10-CM

## 2015-10-18 DIAGNOSIS — M545 Low back pain, unspecified: Secondary | ICD-10-CM

## 2015-10-18 HISTORY — DX: Dorsalgia, unspecified: M54.9

## 2015-10-18 MED ORDER — DICLOFENAC POTASSIUM 50 MG PO TABS: 50.0000 mg | ORAL_TABLET | Freq: Three times a day (TID) | ORAL | Status: DC

## 2015-10-18 MED ORDER — METHOCARBAMOL 500 MG PO TABS: 500.0000 mg | ORAL_TABLET | Freq: Two times a day (BID) | ORAL | Status: DC

## 2015-10-18 NOTE — ED Notes (Signed)
Complains of lower back pain that started yesterday evening. Patient also reports pain episode last week.  Patient reports taking "friends" muscle relaxer last week.   History of back issues.  Encouraged finding pcp.  "I've been coming here since I was 20"

## 2015-10-18 NOTE — ED Provider Notes (Signed)
CSN: [id number]     Arrival date & time 10/18/15  1406 History   First MD Initiated Contact with Patient 10/18/15 1612     Chief Complaint  Patient presents with  . Back Pain   (Consider location/radiation/quality/duration/timing/severity/associated sxs/prior Treatment) HPI Comments: 35 year old male complaining of chronic intermittent pain in the lower back. 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 This keeps his back feeling tight and stiff most of the time. Last week he stated that the pain had increased and is not getting any better. Pain is located to the lower parathoracic and paralumbar musculature. It is worse with movement, twisting, bending, lifting. Denies spinal pain. Denies distal focal paresthesias or weakness. Low back pain has been a recurrent problem and has been seen in the urgent care and emergency Department for this in the past.   Past Medical History  Diagnosis Date  . Back pain    History reviewed. No pertinent past surgical history. No family history on file. Social History  Substance Use Topics  . Smoking status: Current Every Day Smoker -- 1.00 packs/day    Types: Cigarettes  . Smokeless tobacco: None  . Alcohol Use: Yes    Review of Systems  Constitutional: Positive for activity change.  HENT: Negative.   Respiratory: Negative.   Gastrointestinal: Negative.   Genitourinary: Negative.   Musculoskeletal: Positive for myalgias and back pain. Negative for neck pain.       As per HPI  Skin: Negative.   Neurological: Negative for dizziness, weakness, numbness and headaches.    Allergies  Review of patient's allergies indicates no known allergies.  Home Medications   Prior to Admission medications   Medication Sig Start Date End Date Taking? Authorizing Provider  diclofenac (CATAFLAM) 50 MG tablet Take 1 tablet (50 mg total) by mouth 3 (three) times daily.  One tablet TID with food prn pain. 10/18/15   Hayden Rasmussen, NP  methocarbamol (ROBAXIN) 500 MG tablet Take 1 tablet (500 mg total) by mouth 2 (two) times daily. 10/18/15   Hayden Rasmussen, NP   Meds Ordered and Administered this Visit  Medications - No data to display  BP 161/99 mmHg  Pulse 68  Temp(Src) 98.9 F (37.2 C) (Oral)  Resp 12  SpO2 100% No data found.   Physical Exam  Constitutional: He is oriented to person, place, and time. He appears well-developed and well-nourished. No distress.  HENT:  Head: Normocephalic and atraumatic.  Eyes: EOM are normal. Left eye exhibits no discharge.  Neck: Normal range of motion. Neck supple.  Cardiovascular: Normal rate.   Pulmonary/Chest: Effort normal.  Musculoskeletal: Normal range of motion. He exhibits tenderness. He exhibits no edema.  Tenderness to the lower para thoracic and paralumbar musculature. No deformity or discoloration. No spinal tenderness, deformity. Patient is able to get onto and off the exam table without assistance. Lower extremity strength is 5 over 5 bilaterally.  Neurological: He is alert and oriented to person, place, and time. No cranial nerve deficit.  Skin: Skin is warm and dry.  Psychiatric: He has a normal mood and affect.  Nursing note and vitals reviewed.   ED Course  Procedures (including critical care time)  Labs Review Labs Reviewed - No data to display  Imaging Review No results found.   Visual Acuity Review  Right Eye Distance:   Left Eye Distance:   Bilateral Distance:  Right Eye Near:   Left Eye Near:    Bilateral Near:         MDM   1. Bilateral low back pain without sciatica   2. Muscle strain    Meds ordered this encounter  Medications  . diclofenac (CATAFLAM) 50 MG tablet    Sig: Take 1 tablet (50 mg total) by mouth 3 (three) times daily. One tablet TID with food prn pain.    Dispense:  21 tablet    Refill:  0    Order Specific Question:  Supervising Provider    Answer:   Charm RingsHONIG, ERIN J [id number]]  . methocarbamol (ROBAXIN) 500 MG tablet    Sig: Take 1 tablet (500 mg total) by mouth 2 (two) times daily.    Dispense:  20 tablet    Refill:  0    Order Specific Question:  Supervising Provider    Answer:  Charm RingsHONIG, ERIN J [[id number]]   Heat and stretches several times a day Use Thermacare heat wraps or heating pad. Limit heavy lifting. May need PT. F/U with a PCP    Hayden Rasmussenavid Juel Bellerose, NP 10/18/15 [phone]

## 2015-10-18 NOTE — Discharge Instructions (Signed)
Back Pain, Adult Heat and stretches several times a day Use Thermacare heat wraps or heating pad. Back pain is very common in adults.The cause of back pain is rarely dangerous and the pain often gets better over time.The cause of your back pain may not be known. Some common causes of back pain include:  Strain of the muscles or ligaments supporting the spine.  Wear and tear (degeneration) of the spinal disks.  Arthritis.  Direct injury to the back. For many people, back pain may return. Since back pain is rarely dangerous, most people can learn to manage this condition on their own. HOME CARE INSTRUCTIONS Watch your back pain for any changes. The following actions may help to lessen any discomfort you are feeling:  Remain active. It is stressful on your back to sit or stand in one place for long periods of time. Do not sit, drive, or stand in one place for more than 30 minutes at a time. Take short walks on even surfaces as soon as you are able.Try to increase the length of time you walk each day.  Exercise regularly as directed by your health care provider. Exercise helps your back heal faster. It also helps avoid future injury by keeping your muscles strong and flexible.  Do not stay in bed.Resting more than 1-2 days can delay your recovery.  Pay attention to your body when you bend and lift. The most comfortable positions are those that put less stress on your recovering back. Always use proper lifting techniques, including:  Bending your knees.  Keeping the load close to your body.  Avoiding twisting.  Find a comfortable position to sleep. Use a firm mattress and lie on your side with your knees slightly bent. If you lie on your back, put a pillow under your knees.  Avoid feeling anxious or stressed.Stress increases muscle tension and can worsen back pain.It is important to recognize when you are anxious or stressed and learn ways to manage it, such as with exercise.  Take  medicines only as directed by your health care provider. Over-the-counter medicines to reduce pain and inflammation are often the most helpful.Your health care provider may prescribe muscle relaxant drugs.These medicines help dull your pain so you can more quickly return to your normal activities and healthy exercise.  Apply ice to the injured area:  Put ice in a plastic bag.  Place a towel between your skin and the bag.  Leave the ice on for 20 minutes, 2-3 times a day for the first 2-3 days. After that, ice and heat may be alternated to reduce pain and spasms.  Maintain a healthy weight. Excess weight puts extra stress on your back and makes it difficult to maintain good posture. SEEK MEDICAL CARE IF:  You have pain that is not relieved with rest or medicine.  You have increasing pain going down into the legs or buttocks.  You have pain that does not improve in one week.  You have night pain.  You lose weight.  You have a fever or chills. SEEK IMMEDIATE MEDICAL CARE IF:   You develop new bowel or bladder control problems.  You have unusual weakness or numbness in your arms or legs.  You develop nausea or vomiting.  You develop abdominal pain.  You feel faint.   This information is not intended to replace advice given to you by your health care provider. Make sure you discuss any questions you have with your health care provider.   Document  Released: 05/29/2005 Document Revised: 06/19/2014 Document Reviewed: 09/30/2013 Elsevier Interactive Patient Education [employer].

## 2017-04-19 ENCOUNTER — Ambulatory Visit (HOSPITAL_COMMUNITY)
Admission: EM | Admit: 2017-04-19 | Discharge: 2017-04-19 | Disposition: A | Discharge status: Home / Self Care | Payer: Self-pay | Attending: Family Medicine | Admitting: Family Medicine

## 2017-04-19 ENCOUNTER — Encounter (HOSPITAL_COMMUNITY): Payer: Self-pay | Admitting: Emergency Medicine

## 2017-04-19 DIAGNOSIS — K0889 Other specified disorders of teeth and supporting structures: Secondary | ICD-10-CM

## 2017-04-19 MED ORDER — PENICILLIN V POTASSIUM 500 MG PO TABS: 500.0000 mg | ORAL_TABLET | Freq: Four times a day (QID) | ORAL | 0 refills | Status: AC

## 2017-04-19 MED ORDER — HYDROCODONE-ACETAMINOPHEN 5-325 MG PO TABS: 1.0000 | ORAL_TABLET | Freq: Four times a day (QID) | ORAL | 0 refills | Status: AC | PRN

## 2017-04-19 NOTE — ED Triage Notes (Signed)
PT has upper left dental pain. Face is swollen.

## 2017-04-19 NOTE — ED Provider Notes (Addendum)
[facility]    CSN: [id number] Arrival date & time: 04/19/17  1701     History   Chief Complaint Chief Complaint  Patient presents with  . Dental Pain    HPI Tracy Tate is a 36 y.o. male.   The history is provided by the patient.  Dental Pain  Location:  Upper Upper teeth location:  14/LU 1st molar Quality:  Throbbing Severity:  Severe Onset quality:  Gradual Duration:  2 weeks Timing:  Constant Progression:  Worsening Chronicity:  New Context: dental caries and dental fracture   Relieved by:  Nothing Ineffective treatments:  Acetaminophen   Past Medical History:  Diagnosis Date  . Back pain     There are no active problems to display for this patient.   History reviewed. No pertinent surgical history.     Home Medications    Prior to Admission medications   Medication Sig Start Date End Date Taking? Authorizing Provider  HYDROcodone-acetaminophen (NORCO/VICODIN) 5-325 MG tablet Take 1 tablet every 6 (six) hours as needed for up to 5 days by mouth. 04/19/17 04/24/17  Lucia EstelleZheng, Marcelyn Ruppe, NP  penicillin v potassium (VEETID) 500 MG tablet Take 1 tablet (500 mg total) 4 (four) times daily for 7 days by mouth. 04/19/17 04/26/17  Lucia EstelleZheng, Emery Binz, NP    Family History No family history on file.  Social History Social History   Tobacco Use  . Smoking status: Current Every Day Smoker    Packs/day: 1.00    Types: Cigarettes  Substance Use Topics  . Alcohol use: Yes  . Drug use: No     Allergies   Patient has no known allergies.   Review of Systems Review of Systems  Constitutional:       See HPI     Physical Exam Triage Vital Signs ED Triage Vitals  Enc Vitals Group     BP 04/19/17 1719 (!) 175/116     Pulse Rate 04/19/17 1719 84     Resp 04/19/17 1719 16     Temp 04/19/17 1719 98.6 F (37 C)     Temp src --      SpO2 04/19/17 1719 100 %     Weight 04/19/17 1719 222 lb (100.7 kg)     Height 04/19/17 1719 6\' 1"  (1.854 m)     Head  Circumference --      Peak Flow --      Pain Score 04/19/17 1720 8     Pain Loc --      Pain Edu? --      Excl. in GC? --    No data found.  Updated Vital Signs BP (!) 175/116   Pulse 84   Temp 98.6 F (37 C)   Resp 16   Ht 6\' 1"  (1.854 m)   Wt 222 lb (100.7 kg)   SpO2 100%   BMI 29.29 kg/m   Visual Acuity Right Eye Distance:   Left Eye Distance:   Bilateral Distance:    Right Eye Near:   Left Eye Near:    Bilateral Near:     Physical Exam  Constitutional: He is oriented to person, place, and time. He appears well-developed and well-nourished.  HENT:  Mouth/Throat:    Cardiovascular: Normal rate, regular rhythm and normal heart sounds.  Pulmonary/Chest: Effort normal and breath sounds normal.  Neurological: He is alert and oriented to person, place, and time.  Nursing note and vitals reviewed.    UC Treatments / Results  Labs (all labs ordered are listed, but only abnormal results are displayed) Labs Reviewed - No data to display  EKG  EKG Interpretation None       Radiology No results found.  Procedures Procedures (including critical care time)  Medications Ordered in UC Medications - No data to display   Initial Impression / Assessment and Plan / UC Course  I have reviewed the triage vital signs and the nursing notes.  Pertinent labs & imaging results that were available during my care of the patient were reviewed by me and considered in my medical decision making (see chart for details).  Final Clinical Impressions(s) / UC Diagnoses   Final diagnoses:  Toothache   Prescriptions given (see below). Reviewed directions for usage and side effects. Patient states understanding and will call with questions or problems. Patient instructed to f/u with a dentis; patient will do so. Discharge instruction given.  ED Discharge Orders        Ordered    penicillin v potassium (VEETID) 500 MG tablet  4 times daily     04/19/17 1816     HYDROcodone-acetaminophen (NORCO/VICODIN) 5-325 MG tablet  Every 6 hours PRN     04/19/17 1816       Controlled Substance Prescriptions [place] Controlled Substance Registry consulted? Yes       Lucia EstelleZheng, Ciarah Peace, NP 04/19/17 1929

## 2017-10-31 ENCOUNTER — Encounter (HOSPITAL_COMMUNITY): Payer: Self-pay | Admitting: Emergency Medicine

## 2017-10-31 ENCOUNTER — Ambulatory Visit (HOSPITAL_COMMUNITY)
Admission: EM | Admit: 2017-10-31 | Discharge: 2017-10-31 | Disposition: A | Discharge status: Home / Self Care | Payer: Self-pay | Attending: Family Medicine | Admitting: Family Medicine

## 2017-10-31 DIAGNOSIS — L255 Unspecified contact dermatitis due to plants, except food: Secondary | ICD-10-CM

## 2017-10-31 MED ORDER — PREDNISONE 10 MG (48) PO TBPK: ORAL_TABLET | ORAL | 0 refills | Status: DC

## 2017-10-31 NOTE — ED Triage Notes (Signed)
Pt has poison ivy rash on L arm, rash is extensive, L arm is swollen and red.

## 2017-10-31 NOTE — ED Provider Notes (Signed)
  [facility]   [id number] 10/31/17 Arrival Time: 1550  ASSESSMENT & PLAN:  1. Rhus dermatitis     Meds ordered this encounter  Medications  . predniSONE (STERAPRED UNI-PAK 48 TAB) 10 MG (48) TBPK tablet    Sig: Take as directed.    Dispense:  48 tablet    Refill:  0   OTC Benadryl if needed. Reviewed expectations re: course of current medical issues. Questions answered. Outlined signs and symptoms indicating need for more acute intervention. Patient verbalized understanding. After Visit Summary given.   SUBJECTIVE:  Tracy Tate is a 37 y.o. male who presents with a skin complaint.   Location: L arm Onset: gradual Duration: a few days Pruritic? Yes Painful? No Progression: stable  Drainage? No  Known trigger? Questions poison ivy. New soaps/lotions/topicals/detergents? No Environmental exposures or allergies? grasses Contacts with similar? No Recent travel? No  Other associated symptoms: none Therapies tried thus far: none Denies fever. No specific aggravating or alleviating factors reported.  ROS: As per HPI.  OBJECTIVE: Vitals:   10/31/17 1607  BP: (!) 145/86  Pulse: 95  Resp: 18  Temp: 98.8 F (37.1 C)  SpO2: 99%    General appearance: alert; no distress Lungs: clear to auscultation bilaterally Heart: regular rate and rhythm Extremities: no edema Skin: warm and dry; areas of linear papules and vesicles with surrounding erythema over L forearm; no signs of infection Psychological: alert and cooperative; normal mood and affect  No Known Allergies  Past Medical History:  Diagnosis Date  . Back pain    Social History   Socioeconomic History  . Marital status: Single    Spouse name: Not on file  . Number of children: Not on file  . Years of education: Not on file  . Highest education level: Not on file  Occupational History  . Not on file  Social Needs  . Financial resource strain: Not on file  . Food insecurity:    Worry:  Not on file    Inability: Not on file  . Transportation needs:    Medical: Not on file    Non-medical: Not on file  Tobacco Use  . Smoking status: Current Every Day Smoker    Packs/day: 1.00    Types: Cigarettes  Substance and Sexual Activity  . Alcohol use: Yes  . Drug use: No  . Sexual activity: Not on file  Lifestyle  . Physical activity:    Days per week: Not on file    Minutes per session: Not on file  . Stress: Not on file  Relationships  . Social connections:    Talks on phone: Not on file    Gets together: Not on file    Attends religious service: Not on file    Active member of club or organization: Not on file    Attends meetings of clubs or organizations: Not on file    Relationship status: Not on file  . Intimate partner violence:    Fear of current or ex partner: Not on file    Emotionally abused: Not on file    Physically abused: Not on file    Forced sexual activity: Not on file  Other Topics Concern  . Not on file  Social History Narrative  . Not on file   No family history on file. History reviewed. No pertinent surgical history.   Mardella Layman, MD 11/07/17 [phone]

## 2019-02-07 ENCOUNTER — Ambulatory Visit (INDEPENDENT_AMBULATORY_CARE_PROVIDER_SITE_OTHER): Payer: Self-pay

## 2019-02-07 ENCOUNTER — Ambulatory Visit (HOSPITAL_COMMUNITY)
Admission: EM | Admit: 2019-02-07 | Discharge: 2019-02-07 | Disposition: A | Discharge status: Home / Self Care | Payer: Self-pay | Attending: Emergency Medicine | Admitting: Emergency Medicine

## 2019-02-07 ENCOUNTER — Other Ambulatory Visit: Payer: Self-pay

## 2019-02-07 ENCOUNTER — Encounter (HOSPITAL_COMMUNITY): Payer: Self-pay

## 2019-02-07 DIAGNOSIS — I10 Essential (primary) hypertension: Secondary | ICD-10-CM

## 2019-02-07 DIAGNOSIS — M7022 Olecranon bursitis, left elbow: Secondary | ICD-10-CM

## 2019-02-07 DIAGNOSIS — M79602 Pain in left arm: Secondary | ICD-10-CM

## 2019-02-07 HISTORY — DX: Migraine, unspecified, not intractable, without status migrainosus: G43.909

## 2019-02-07 LAB — CBC WITH DIFFERENTIAL/PLATELET
Abs Immature Granulocytes: 0.02 10*3/uL (ref 0.00–0.07)
Basophils Absolute: 0 10*3/uL (ref 0.0–0.1)
Basophils Relative: 1 %
Eosinophils Absolute: 0.2 10*3/uL (ref 0.0–0.5)
Eosinophils Relative: 2 %
HCT: 48.1 % (ref 39.0–52.0)
Hemoglobin: 15.8 g/dL (ref 13.0–17.0)
Immature Granulocytes: 0 %
Lymphocytes Relative: 33 %
Lymphs Abs: 2.8 10*3/uL (ref 0.7–4.0)
MCH: 28.5 pg (ref 26.0–34.0)
MCHC: 32.8 g/dL (ref 30.0–36.0)
MCV: 86.7 fL (ref 80.0–100.0)
Monocytes Absolute: 0.5 10*3/uL (ref 0.1–1.0)
Monocytes Relative: 7 %
Neutro Abs: 4.7 10*3/uL (ref 1.7–7.7)
Neutrophils Relative %: 57 %
Platelets: 246 10*3/uL (ref 150–400)
RBC: 5.55 MIL/uL (ref 4.22–5.81)
RDW: 12.7 % (ref 11.5–15.5)
WBC: 8.3 10*3/uL (ref 4.0–10.5)
nRBC: 0 % (ref 0.0–0.2)

## 2019-02-07 LAB — COMPREHENSIVE METABOLIC PANEL
ALT: 27 U/L (ref 0–44)
AST: 26 U/L (ref 15–41)
Albumin: 4.4 g/dL (ref 3.5–5.0)
Alkaline Phosphatase: 59 U/L (ref 38–126)
Anion gap: 10 (ref 5–15)
BUN: 8 mg/dL (ref 6–20)
CO2: 25 mmol/L (ref 22–32)
Calcium: 9.1 mg/dL (ref 8.9–10.3)
Chloride: 103 mmol/L (ref 98–111)
Creatinine, Ser: 0.93 mg/dL (ref 0.61–1.24)
GFR calc Af Amer: >60 mL/min (ref >60)
GFR calc non Af Amer: >60 mL/min (ref >60)
Glucose, Bld: 111 mg/dL — ABNORMAL HIGH (ref 70–99)
Potassium: 4.1 mmol/L (ref 3.5–5.1)
Sodium: 138 mmol/L (ref 135–145)
Total Bilirubin: 1 mg/dL (ref 0.3–1.2)
Total Protein: 7.2 g/dL (ref 6.5–8.1)

## 2019-02-07 MED ORDER — TRAMADOL HCL 50 MG PO TABS: 50.0000 mg | ORAL_TABLET | Freq: Four times a day (QID) | ORAL | 0 refills | Status: DC | PRN

## 2019-02-07 MED ORDER — CLONIDINE HCL 0.1 MG PO TABS
0.2000 mg | ORAL_TABLET | Freq: Once | ORAL | Status: AC
  Administered 2019-02-07: 12:00:00 0.2 mg via ORAL

## 2019-02-07 MED ORDER — CLONIDINE HCL 0.1 MG PO TABS
ORAL_TABLET | ORAL | Status: AC
  Filled 2019-02-07: qty 2

## 2019-02-07 MED ORDER — HYDROCODONE-ACETAMINOPHEN 5-325 MG PO TABS
ORAL_TABLET | ORAL | Status: AC
  Filled 2019-02-07: qty 1

## 2019-02-07 MED ORDER — CYCLOBENZAPRINE HCL 5 MG PO TABS: 5.0000 mg | ORAL_TABLET | Freq: Two times a day (BID) | ORAL | 0 refills | Status: DC | PRN

## 2019-02-07 MED ORDER — LISINOPRIL 10 MG PO TABS: 10.0000 mg | ORAL_TABLET | Freq: Every day | ORAL | 0 refills | Status: DC

## 2019-02-07 MED ORDER — HYDROCODONE-ACETAMINOPHEN 5-325 MG PO TABS
1.0000 | ORAL_TABLET | Freq: Once | ORAL | Status: AC
  Administered 2019-02-07: 11:00:00 1 via ORAL

## 2019-02-07 NOTE — Discharge Instructions (Addendum)
°  You may use the ace wrap to help with pain and swelling. You may take 500mg  acetaminophen every 4-6 hours or in combination with ibuprofen 400-600mg  every 6-8 hours as needed for pain, inflammation, and fever.  Be sure to drink at least eight 8oz glasses of water to stay well hydrated and get at least 8 hours of sleep at night, preferably more while sick.   Based on your high blood pressure, be sure to establish care with a family doctor of ongoing monitoring and maintenance of your blood pressure. If your blood pressure remains untreated, you are at increased risk of heart attack, stroke, kidney failure, and early death.    Call 911 or have someone drive you to the hospital if symptoms worsening- develop headache, dizziness, change in vision, chest pain or trouble breathing.

## 2019-02-07 NOTE — ED Provider Notes (Signed)
[facility]    CSN: [id number] Arrival date & time: 02/07/19  [id number]      History   Chief Complaint Chief Complaint  Patient presents with  . Arm Pain    HPI Tracy PublicJames W Tate is a 38 y.o. male.   HPI  Tracy Tate is a 38 y.o. male presenting to UC with c/o worsening Left arm pain and swelling that started 1 week ago after bumping his elbow on a door frame.  Pain was a little sore at that time but significantly worsened yesterday while at work. 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 Pains is aching and sore, limited ROM due to the pain.  He tried ice w/o relief but a warm bath did help.  No pain medication tried PTA.  No prior hx of left elbow injury. He is Right hand dominant.  BP elevated in triage. Pt denies hx of HTN but has not seen a PCP in years.  He is not aware of any blood pressure problems in his family. He does recall having to miss work about 1-2 weeks ago due to having a severe headache and wonders now if that was due to his blood pressure. Denies HA, chest pain, or nausea at this time.     Past Medical History:  Diagnosis Date  . Back pain   . Migraines     There are no active problems to display for this patient.   History reviewed. No pertinent surgical history.     Home Medications    Prior to Admission medications   Medication Sig Start Date End Date Taking? Authorizing Provider  cyclobenzaprine (FLEXERIL) 5 MG tablet Take 1-2 tablets (5-10 mg total) by mouth 2 (two) times daily as needed for muscle spasms. 02/07/19   Lurene ShadowPhelps, Ladiamond Gallina O, PA-C  lisinopril (ZESTRIL) 10 MG tablet Take 1 tablet (10 mg total) by mouth daily. 02/07/19   Lurene ShadowPhelps, Maxen Rowland O, PA-C  predniSONE (STERAPRED UNI-PAK 48 TAB) 10 MG (48) TBPK tablet Take as directed. 10/31/17   Mardella LaymanHagler, Brian, MD  traMADol (ULTRAM) 50 MG tablet Take 1 tablet (50 mg total) by mouth every 6 (six) hours as needed. 02/07/19   Lurene ShadowPhelps, Aireona Torelli O, PA-C    Family History Family History  Problem Relation Age of Onset  . Healthy Mother   . Healthy Father     Social History Social History   Tobacco Use  . Smoking status: Current Every Day Smoker    Packs/day: 1.00    Types: Cigarettes  . Smokeless tobacco: Never Used  Substance Use Topics  . Alcohol use: Yes    Comment: margaritas everyday  . Drug use: No     Allergies   Patient has no known allergies.   Review of Systems Review of Systems  Constitutional: Negative for chills and fever.  Respiratory: Negative for chest tightness and shortness of breath.   Gastrointestinal: Negative for abdominal pain, diarrhea, nausea and vomiting.  Musculoskeletal: Positive for arthralgias, joint swelling and myalgias. Negative for back pain, neck pain and neck stiffness.  Skin: Negative for color change, rash and wound.  Neurological: Positive for weakness (left arm due to severe pain). Negative for dizziness, light-headedness, numbness and headaches.     Physical Exam Triage Vital Signs ED Triage Vitals  Enc Vitals Group     BP 02/07/19 1030 (!) 166/114     Pulse Rate 02/07/19 1030 77     Resp 02/07/19  1030 18     Temp 02/07/19 1030 98.6 F (37 C)     Temp Source 02/07/19 1030 Tympanic     SpO2 02/07/19 1030 97 %     Weight --      Height --      Head Circumference --      Peak Flow --      Pain Score 02/07/19 1031 9     Pain Loc --      Pain Edu? --      Excl. in GC? --    No data found.  Updated Vital Signs BP (!) 207/128 (BP Location: Right Arm) Comment: bp retake. provider notified  Pulse 77   Temp 98.6 F (37 C) (Tympanic)   Resp 18   SpO2 97%   Visual Acuity Right Eye Distance:   Left Eye Distance:   Bilateral Distance:    Right Eye Near:   Left Eye Near:    Bilateral Near:     Physical Exam Vitals signs and nursing note reviewed.  Constitutional:      Appearance: Normal appearance. He is well-developed.  HENT:     Head: Normocephalic and  atraumatic.     Nose: Nose normal.  Eyes:     Extraocular Movements: Extraocular movements intact.     Conjunctiva/sclera: Conjunctivae normal.     Pupils: Pupils are equal, round, and reactive to light.  Neck:     Musculoskeletal: Normal range of motion.  Cardiovascular:     Rate and Rhythm: Normal rate and regular rhythm.     Pulses:          Radial pulses are 2+ on the left side.  Pulmonary:     Effort: Pulmonary effort is normal. No respiratory distress.     Breath sounds: Normal breath sounds.  Abdominal:     General: There is no distension.     Palpations: Abdomen is soft.     Tenderness: There is no abdominal tenderness. There is no right CVA tenderness or left CVA tenderness.  Musculoskeletal:        General: Swelling and tenderness present.     Comments: Left arm: full ROM shoulder, no tenderness. Left elbow: mild edema, tenderness, limited ROM due to pain. Left wrist and hand: mild edema and mild tenderness. 4/5 grip strength limited by pain.  Skin:    General: Skin is warm and dry.     Capillary Refill: Capillary refill takes less than 2 seconds.  Neurological:     Mental Status: He is alert and oriented to person, place, and time.  Psychiatric:        Behavior: Behavior normal.      UC Treatments / Results  Labs (all labs ordered are listed, but only abnormal results are displayed) Labs Reviewed  COMPREHENSIVE METABOLIC PANEL - Abnormal; Notable for the following components:      Result Value   Glucose, Bld 111 (*)    All other components within normal limits  CBC WITH DIFFERENTIAL/PLATELET    EKG   Radiology Dg Elbow Complete Left  Result Date: 02/07/2019 CLINICAL DATA:  Injury 2 weeks ago with persistent pain. EXAM: LEFT ELBOW - COMPLETE 3+ VIEW COMPARISON:  None. FINDINGS: There is no evidence of fracture, dislocation, or joint effusion. There is no evidence of arthropathy or other focal bone abnormality. Soft tissues are unremarkable. IMPRESSION:  Negative. Electronically Signed   By: Paulina Fusi M.D.   On: 02/07/2019 11:23    Procedures Procedures (including  critical care time)  Medications Ordered in UC Medications  HYDROcodone-acetaminophen (NORCO/VICODIN) 5-325 MG per tablet 1 tablet (1 tablet Oral Given 02/07/19 1100)  cloNIDine (CATAPRES) tablet 0.2 mg (0.2 mg Oral Given 02/07/19 1216)  HYDROcodone-acetaminophen (NORCO/VICODIN) 5-325 MG per tablet (has no administration in time range)  cloNIDine (CATAPRES) 0.1 MG tablet (has no administration in time range)    Initial Impression / Assessment and Plan / UC Course  I have reviewed the triage vital signs and the nursing notes.  Pertinent labs & imaging results that were available during my care of the patient were reviewed by me and considered in my medical decision making (see chart for details).     Left arm pain likely due to traumatic olecranon bursitis w/o evidence of underlying infection. Will tx with ace wrap, cool compresses, tramadol, tylenol and motrin.  BP elevated, continued to increase on rechecks.  Pt symptomatic in regards to elevated BP at this time.  Clonidine given in UC, pt was discharged by nursing staff prior to BP check.  Pt was advised to recheck his BP and monitor at home or local pharmacy. Lisinopril sent to pharmacy, advised to f/u with PCP as well for BP management. Discussed symptoms that warrant emergent care in the ED.  Stat labs- unremarkable, no evidence of acute kidney failure.  Will continue with plan for outpatient management at this time.   Final Clinical Impressions(s) / UC Diagnoses   Final diagnoses:  Olecranon bursitis of left elbow  Left arm pain  Essential hypertension, benign     Discharge Instructions      You may use the ace wrap to help with pain and swelling. You may take 500mg  acetaminophen every 4-6 hours or in combination with ibuprofen 400-600mg  every 6-8 hours as needed for pain, inflammation, and fever.  Be  sure to drink at least eight 8oz glasses of water to stay well hydrated and get at least 8 hours of sleep at night, preferably more while sick.   Based on your high blood pressure, be sure to establish care with a family doctor of ongoing monitoring and maintenance of your blood pressure. If your blood pressure remains untreated, you are at increased risk of heart attack, stroke, kidney failure, and early death.    Call 911 or have someone drive you to the hospital if symptoms worsening- develop headache, dizziness, change in vision, chest pain or trouble breathing.     ED Prescriptions    Medication Sig Dispense Auth. Provider   traMADol (ULTRAM) 50 MG tablet Take 1 tablet (50 mg total) by mouth every 6 (six) hours as needed. 15 tablet Gerarda Fraction, Bridgette Wolden O, PA-C   cyclobenzaprine (FLEXERIL) 5 MG tablet Take 1-2 tablets (5-10 mg total) by mouth 2 (two) times daily as needed for muscle spasms. 20 tablet Gerarda Fraction, Rhoda Waldvogel O, PA-C   lisinopril (ZESTRIL) 10 MG tablet Take 1 tablet (10 mg total) by mouth daily. 30 tablet Noe Gens, PA-C     Controlled Substance Prescriptions [place] Controlled Substance Registry consulted? Not Applicable   Tyrell Antonio 02/07/19 1409

## 2019-02-07 NOTE — ED Triage Notes (Signed)
Pt presents to UC with left arm pain from elbow down to hand x1 week. Pt reports being unable to move elbow without pain. Pt reports taking OTC pain meds without relief. Pt reports ice did not help. Taking a warm bath helped.

## 2019-10-08 ENCOUNTER — Other Ambulatory Visit: Payer: Self-pay

## 2019-10-08 ENCOUNTER — Encounter (HOSPITAL_COMMUNITY): Payer: Self-pay

## 2019-10-08 ENCOUNTER — Ambulatory Visit (HOSPITAL_COMMUNITY)
Admission: EM | Admit: 2019-10-08 | Discharge: 2019-10-08 | Disposition: A | Discharge status: Home / Self Care | Payer: 59 | Attending: Urgent Care | Admitting: Urgent Care

## 2019-10-08 DIAGNOSIS — I10 Essential (primary) hypertension: Secondary | ICD-10-CM

## 2019-10-08 DIAGNOSIS — R030 Elevated blood-pressure reading, without diagnosis of hypertension: Secondary | ICD-10-CM

## 2019-10-08 DIAGNOSIS — H109 Unspecified conjunctivitis: Secondary | ICD-10-CM

## 2019-10-08 DIAGNOSIS — H5789 Other specified disorders of eye and adnexa: Secondary | ICD-10-CM

## 2019-10-08 MED ORDER — LISINOPRIL 10 MG PO TABS: 10.0000 mg | ORAL_TABLET | Freq: Every day | ORAL | 0 refills | Status: DC

## 2019-10-08 MED ORDER — ERYTHROMYCIN 5 MG/GM OP OINT: TOPICAL_OINTMENT | OPHTHALMIC | 0 refills | Status: DC

## 2019-10-08 NOTE — ED Triage Notes (Addendum)
Pt states he has pink eye. Pt states it been like  that for 2 days. ( left eye ) pt states he's out of his B/p and would like a refill.

## 2019-10-08 NOTE — ED Provider Notes (Signed)
[facility]   MRN: [id number] DOB: Mar 09, 1981  Subjective:   Tracy Tate is a 39 y.o. male presenting for 2 day hx of acute onset left eye redness, mild discomfort and watering.  Denies trauma, photophobia, vision change.  Does not use glasses or contacts.  Patient is also requesting refills of his lisinopril.  Does not have a PCP.  Denies headache, confusion, weakness, chest pain, heart racing, belly pain, hematuria, lower leg swelling.  Patient admits that he does not practice a healthy diet.  He does exercise since being told he has had high blood pressure however.  Takes lisinopril.   No Known Allergies  Past Medical History:  Diagnosis Date  . Back pain   . Migraines      History reviewed. No pertinent surgical history.  Family History  Problem Relation Age of Onset  . Healthy Mother   . Healthy Father     Social History   Tobacco Use  . Smoking status: Current Every Day Smoker    Packs/day: 1.00    Types: Cigarettes  . Smokeless tobacco: Never Used  Substance Use Topics  . Alcohol use: Yes    Comment: margaritas everyday  . Drug use: No    ROS   Objective:   Vitals: BP (!) 166/108 (BP Location: Right Arm)   Pulse 92   Temp 99.2 F (37.3 C) (Oral)   Resp 18   Wt 228 lb (103.4 kg)   SpO2 98%   BMI 30.08 kg/m   Physical Exam Constitutional:      General: He is not in acute distress.    Appearance: Normal appearance. He is well-developed. He is not ill-appearing, toxic-appearing or diaphoretic.  HENT:     Head: Normocephalic and atraumatic.     Right Ear: External ear normal.     Left Ear: External ear normal.     Nose: Nose normal.     Mouth/Throat:     Mouth: Mucous membranes are moist.     Pharynx: Oropharynx is clear.  Eyes:     General: Lids are everted, no foreign bodies appreciated. No scleral icterus.       Right eye: No foreign body, discharge or hordeolum.        Left eye: Discharge (clear watery with injected conjunctiva)  present.No foreign body or hordeolum.     Extraocular Movements: Extraocular movements intact.     Conjunctiva/sclera:     Right eye: Right conjunctiva is not injected. No chemosis, exudate or hemorrhage.    Left eye: Left conjunctiva is injected. No chemosis, exudate or hemorrhage.    Pupils: Pupils are equal, round, and reactive to light.  Neck:     Vascular: No carotid bruit.  Cardiovascular:     Rate and Rhythm: Normal rate and regular rhythm.     Heart sounds: Normal heart sounds. No murmur. No friction rub. No gallop.   Pulmonary:     Effort: Pulmonary effort is normal. No respiratory distress.     Breath sounds: Normal breath sounds. No stridor. No wheezing, rhonchi or rales.  Musculoskeletal:     Cervical back: Normal range of motion and neck supple.     Right lower leg: No edema.     Left lower leg: No edema.  Skin:    General: Skin is warm and dry.  Neurological:     Mental Status: He is alert and oriented to person, place, and time.     Cranial Nerves: No cranial nerve deficit.  Motor: No weakness.     Coordination: Coordination normal.     Gait: Gait normal.  Psychiatric:        Mood and Affect: Mood normal.        Behavior: Behavior normal.        Thought Content: Thought content normal.        Judgment: Judgment normal.     Assessment and Plan :   PDMP not reviewed this encounter.  1. Bacterial conjunctivitis of left eye   2. Essential hypertension   3. Elevated blood pressure reading   4. Redness of left eye     Start erythromycin eye ointment for bacterial conjunctivitis.  Counseled patient on need for significant dietary modifications.  Refilled lisinopril. Counseled on need to establish with a PCP for further work-up and follow-up.  Provided patient with information to do so. Counseled patient on potential for adverse effects with medications prescribed/recommended today, ER and return-to-clinic precautions discussed, patient verbalized  understanding.    Jaynee Eagles, PA-C 10/08/19 1431

## 2019-10-08 NOTE — Discharge Instructions (Addendum)
For diabetes or elevated blood sugar, please make sure you are avoiding starchy, carbohydrate foods like pasta, breads, pastry, rice, potatoes, desserts. These foods can elevated your blood sugar. Also, avoid sodas, sweet teas, sugary beverages, fruit juices.  Drinking plain water will be much more helpful, try 64 ounces of water daily.  It is okay to flavor your water naturally by cutting cucumber or lemon and mint or lime, placing it in a picture with water and drinking it over a period of 2 to 3 days as long as it remains refrigerated.    For elevated blood pressure, make sure you are monitoring salt in your diet.  Do not eat restaurant foods and limit processed foods at home, prepare/cook your own foods at home.  Processed foods include things like frozen meals preseasoned meats and dinners, deli meats, canned foods as they are high in sodium/salt.  Make sure your pain attention to sodium labels on foods you by at the grocery store.  For seasoning you can use a brand called Mrs. Dash which includes a lot of salt free seasonings.  Salads - kale, spinach, cabbage, spring mix; use seeds like pumpkin seeds or sunflower seeds, almonds, walnuts or pecans; you can also use 1-2 hard boiled eggs in your salads Fruits - avocadoes, berries (blueberries, raspberries, blackberries), apples, oranges Vegetables - aspargus, cauliflower, broccoli, green beans, brussel spouts, bell peppers; stay away from starchy vegetables like potatoes, carrots, peas  Regarding meat it is better to eat lean meats and limit your red meat consumption including pork.  Wild caught fish, chicken breast are good options.  DO NOT EAT ANY FOODS ON THIS LIST THAT YOU ARE ALLERGIC TO.  

## 2019-11-15 ENCOUNTER — Other Ambulatory Visit: Payer: Self-pay

## 2019-11-15 ENCOUNTER — Encounter (HOSPITAL_COMMUNITY): Payer: Self-pay | Admitting: Emergency Medicine

## 2019-11-15 ENCOUNTER — Ambulatory Visit (HOSPITAL_COMMUNITY)
Admission: EM | Admit: 2019-11-15 | Discharge: 2019-11-15 | Disposition: A | Discharge status: Home / Self Care | Payer: 59 | Attending: Physician Assistant | Admitting: Physician Assistant

## 2019-11-15 DIAGNOSIS — K047 Periapical abscess without sinus: Secondary | ICD-10-CM | POA: Diagnosis not present

## 2019-11-15 DIAGNOSIS — K0889 Other specified disorders of teeth and supporting structures: Secondary | ICD-10-CM

## 2019-11-15 HISTORY — DX: Essential (primary) hypertension: I10

## 2019-11-15 MED ORDER — ACETAMINOPHEN 500 MG PO TABS: 1000.0000 mg | ORAL_TABLET | Freq: Four times a day (QID) | ORAL | 0 refills | Status: DC | PRN

## 2019-11-15 MED ORDER — IBUPROFEN 800 MG PO TABS: 800.0000 mg | ORAL_TABLET | Freq: Three times a day (TID) | ORAL | 0 refills | Status: DC

## 2019-11-15 MED ORDER — AMOXICILLIN-POT CLAVULANATE 875-125 MG PO TABS: 1.0000 | ORAL_TABLET | Freq: Two times a day (BID) | ORAL | 0 refills | Status: AC

## 2019-11-15 MED ORDER — TRAMADOL HCL 50 MG PO TABS: 50.0000 mg | ORAL_TABLET | Freq: Four times a day (QID) | ORAL | 0 refills | Status: AC | PRN

## 2019-11-15 NOTE — ED Triage Notes (Signed)
Top, right dental pain for 2 weeks.

## 2019-11-15 NOTE — ED Provider Notes (Signed)
[facility]    CSN: [id number] Arrival date & time: 11/15/19  1002      History   Chief Complaint Chief Complaint  Patient presents with  . Dental Pain    HPI Tracy Tate is a 39 y.o. male.   Patient reports for 2 weeks of worsening right upper dental pain.  He reports he has 2-3 known bad teeth in the right upper dentition in the back.  He reports trying to remove these himself several years ago.  Reports pain has been getting much worse and has began to spread there his face and feels it in his ear as well.  Denies fevers or chills.  Reports it hurts to chew.  Denies difficulty swallowing.  He has been trying both Tylenol and ibuprofen over-the-counter without relief.  He reports last night he may be only slept 2 hours.  Patient reports he has a established dentist and will follow up.     Past Medical History:  Diagnosis Date  . Back pain   . Hypertension   . Migraines     There are no problems to display for this patient.   History reviewed. No pertinent surgical history.     Home Medications    Prior to Admission medications   Medication Sig Start Date End Date Taking? Authorizing Provider  lisinopril (ZESTRIL) 10 MG tablet Take 1 tablet (10 mg total) by mouth daily. 10/08/19  Yes Wallis Bamberg, PA-C  acetaminophen (TYLENOL) 500 MG tablet Take 2 tablets (1,000 mg total) by mouth every 6 (six) hours as needed. 11/15/19   Aamari West, Veryl Speak, PA-C  amoxicillin-clavulanate (AUGMENTIN) 875-125 MG tablet Take 1 tablet by mouth 2 (two) times daily for 10 days. 11/15/19 11/25/19  Prakriti Carignan, Veryl Speak, PA-C  cyclobenzaprine (FLEXERIL) 5 MG tablet Take 1-2 tablets (5-10 mg total) by mouth 2 (two) times daily as needed for muscle spasms. 02/07/19   Lurene Shadow, PA-C  erythromycin ophthalmic ointment Place a 1/2 inch ribbon of ointment into the left lower eyelid four times daily. 10/08/19   Wallis Bamberg, PA-C  ibuprofen (ADVIL) 800 MG tablet Take 1 tablet (800 mg total) by mouth 3  (three) times daily. 11/15/19   Ondine Gemme, Veryl Speak, PA-C  traMADol (ULTRAM) 50 MG tablet Take 1 tablet (50 mg total) by mouth every 6 (six) hours as needed for up to 1 day. 11/15/19 11/16/19  Shakela Donati, Veryl Speak, PA-C    Family History Family History  Problem Relation Age of Onset  . Healthy Mother   . Healthy Father     Social History Social History   Tobacco Use  . Smoking status: Current Every Day Smoker    Packs/day: 1.00    Types: Cigarettes  . Smokeless tobacco: Never Used  Substance Use Topics  . Alcohol use: Yes    Comment: margaritas everyday  . Drug use: No     Allergies   Patient has no known allergies.   Review of Systems Review of Systems   Physical Exam Triage Vital Signs ED Triage Vitals  Enc Vitals Group     BP 11/15/19 1020 (!) 162/102     Pulse Rate 11/15/19 1020 (!) 101     Resp 11/15/19 1020 18     Temp 11/15/19 1020 98.8 F (37.1 C)     Temp Source 11/15/19 1020 Oral     SpO2 11/15/19 1020 97 %     Weight --      Height --  Head Circumference --      Peak Flow --      Pain Score 11/15/19 1018 9     Pain Loc --      Pain Edu? --      Excl. in GC? --    No data found.  Updated Vital Signs BP (!) 162/102 (BP Location: Right Arm) Comment (BP Location): large cuff  Pulse (!) 101   Temp 98.8 F (37.1 C) (Oral)   Resp 18   SpO2 97%   Visual Acuity Right Eye Distance:   Left Eye Distance:   Bilateral Distance:    Right Eye Near:   Left Eye Near:    Bilateral Near:     Physical Exam Vitals and nursing note reviewed.  Constitutional:      General: He is not in acute distress.    Appearance: Normal appearance. He is well-developed. He is not ill-appearing.  HENT:     Head: Normocephalic and atraumatic.     Comments: There is some maxillary tenderness on the right side    Right Ear: Tympanic membrane normal.     Left Ear: Tympanic membrane normal.     Mouth/Throat:     Mouth: Mucous membranes are moist.     Dentition: Abnormal  dentition. Dental tenderness and gingival swelling present. No dental abscesses.     Tongue: Tongue does not deviate from midline.     Palate: No mass and lesions.     Pharynx: Oropharynx is clear. Uvula midline. No pharyngeal swelling, oropharyngeal exudate, posterior oropharyngeal erythema or uvula swelling.   Eyes:     Conjunctiva/sclera: Conjunctivae normal.  Neck:     Comments: No significant submandibular or submental swelling Mild cervical adenopathy on the right side Cardiovascular:     Rate and Rhythm: Tachycardia present.  Pulmonary:     Effort: Pulmonary effort is normal. No respiratory distress.  Musculoskeletal:     Cervical back: Neck supple.     Right lower leg: No edema.     Left lower leg: No edema.  Skin:    General: Skin is warm and dry.  Neurological:     Mental Status: He is alert.      UC Treatments / Results  Labs (all labs ordered are listed, but only abnormal results are displayed) Labs Reviewed - No data to display  EKG   Radiology No results found.  Procedures Procedures (including critical care time)  Medications Ordered in UC Medications - No data to display  Initial Impression / Assessment and Plan / UC Course  I have reviewed the triage vital signs and the nursing notes.  Pertinent labs & imaging results that were available during my care of the patient were reviewed by me and considered in my medical decision making (see chart for details).     #Toothache #Dental infection Patient is a 39 year old presenting with dental infection and significant dental pain.  There is evidence of infection today.  Will start on Augmentin.  Given lack of response so far to over-the-counter medicines will give very short course of tramadol to be followed with ibuprofen and Tylenol combination.  Offered Toradol in clinic however patient is very needle averse.  Strict return emergency department precautions were discussed.  Instructed patient to  follow-up as soon as possible with his dentist.  Patient verbalized understanding of plan of care.  Final Clinical Impressions(s) / UC Diagnoses   Final diagnoses:  Toothache  Dental infection     Discharge Instructions  Take the Augmentin twice a day I given you very short course of tramadol, 4 tablets.  You may take this every 6 hours for severe pain.  You may take Tylenol in addition to this -Do not drive, drink alcohol or operate machinery while taking this medicine as it may make you sleepy  For moderate pain take the 800 mg ibuprofen and two extra strength Tylenol every 8 hours  Schedule follow-up with your dentist as soon as possible  If worsening facial swelling, difficulty swallowing or significant neck swelling please return or report to the emergency department.     ED Prescriptions    Medication Sig Dispense Auth. Provider   amoxicillin-clavulanate (AUGMENTIN) 875-125 MG tablet Take 1 tablet by mouth 2 (two) times daily for 10 days. 20 tablet Ravon Mcilhenny, Marguerita Beards, PA-C   traMADol (ULTRAM) 50 MG tablet Take 1 tablet (50 mg total) by mouth every 6 (six) hours as needed for up to 1 day. 4 tablet Deshana Rominger, Marguerita Beards, PA-C   ibuprofen (ADVIL) 800 MG tablet Take 1 tablet (800 mg total) by mouth 3 (three) times daily. 21 tablet Rheanne Cortopassi, Marguerita Beards, PA-C   acetaminophen (TYLENOL) 500 MG tablet Take 2 tablets (1,000 mg total) by mouth every 6 (six) hours as needed. 30 tablet Joane Postel, Marguerita Beards, PA-C     I have reviewed the PDMP during this encounter.   Purnell Shoemaker, PA-C 11/15/19 1110

## 2019-11-15 NOTE — Discharge Instructions (Signed)
Take the Augmentin twice a day I given you very short course of tramadol, 4 tablets.  You may take this every 6 hours for severe pain.  You may take Tylenol in addition to this -Do not drive, drink alcohol or operate machinery while taking this medicine as it may make you sleepy  For moderate pain take the 800 mg ibuprofen and two extra strength Tylenol every 8 hours  Schedule follow-up with your dentist as soon as possible  If worsening facial swelling, difficulty swallowing or significant neck swelling please return or report to the emergency department.

## 2020-05-04 ENCOUNTER — Ambulatory Visit: Payer: 59

## 2020-07-21 ENCOUNTER — Other Ambulatory Visit: Payer: Self-pay

## 2020-07-21 ENCOUNTER — Encounter (HOSPITAL_COMMUNITY): Payer: Self-pay | Admitting: *Deleted

## 2020-07-21 ENCOUNTER — Ambulatory Visit (HOSPITAL_COMMUNITY)
Admission: EM | Admit: 2020-07-21 | Discharge: 2020-07-21 | Disposition: A | Discharge status: Home / Self Care | Payer: 59 | Attending: Medical Oncology | Admitting: Medical Oncology

## 2020-07-21 DIAGNOSIS — I10 Essential (primary) hypertension: Secondary | ICD-10-CM

## 2020-07-21 DIAGNOSIS — H109 Unspecified conjunctivitis: Secondary | ICD-10-CM | POA: Diagnosis not present

## 2020-07-21 DIAGNOSIS — K047 Periapical abscess without sinus: Secondary | ICD-10-CM

## 2020-07-21 DIAGNOSIS — B9689 Other specified bacterial agents as the cause of diseases classified elsewhere: Secondary | ICD-10-CM

## 2020-07-21 MED ORDER — AMOXICILLIN-POT CLAVULANATE 875-125 MG PO TABS: 1.0000 | ORAL_TABLET | Freq: Two times a day (BID) | ORAL | 0 refills | Status: DC

## 2020-07-21 MED ORDER — ACETAMINOPHEN-CODEINE #3 300-30 MG PO TABS: 1.0000 | ORAL_TABLET | Freq: Four times a day (QID) | ORAL | 0 refills | Status: DC | PRN

## 2020-07-21 MED ORDER — ERYTHROMYCIN 5 MG/GM OP OINT: TOPICAL_OINTMENT | OPHTHALMIC | 0 refills | Status: DC

## 2020-07-21 NOTE — ED Provider Notes (Signed)
[facility]    CSN: [id number] Arrival date & time: 07/21/20  1609      History   Chief Complaint Chief Complaint  Patient presents with  . Conjunctivitis    RT /LT  . Dental Problem    HPI EMANUAL LAMOUNTAIN is a 40 y.o. male.   HPI   Eye redness: Patient states that for the past week he has had redness of his eyes along with mild eye irritation and eye discharge.  The discharge was purulent in nature.  He has used some expired prescription eyedrops that he was prescribed many years ago which have helped with the discharge but has not significantly helped with the redness.  He denies any visual change, headache, fever, cold symptoms.  He does mention that he had a close contact who had bacterial conjunctivitis and was in close contact with them recently.  He does not wear contacts, has not had any eye injury, and denies ever having a formal eye exam.  Dental Pain: Patient reports that for the past few weeks he has had dental pain.  This appears to be secondary to a tooth that needs to be extracted according to his dentist.  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  He has not been on antibiotics for this tooth recently.  He has been taking BC powders for his dental pain. Pain 10/10.  He denies any chest pain or shortness of breath or headache today as he does have elevated blood pressure.  He states that he has known whitecoat syndrome and adamantly declines having his blood pressure rechecked.  He states that he has his blood pressure medication at home, is willing to take as directed and has a blood pressure machine to check his blood pressure at home.  Past Medical History:  Diagnosis Date  . Back pain   . Hypertension   . Migraines     There are no problems to display for this patient.   History reviewed. No pertinent surgical history.     Home Medications    Prior to Admission medications   Medication Sig Start Date End  Date Taking? Authorizing Provider  acetaminophen (TYLENOL) 500 MG tablet Take 2 tablets (1,000 mg total) by mouth every 6 (six) hours as needed. 11/15/19  Yes Darr, Gerilyn Pilgrim, PA-C  cyclobenzaprine (FLEXERIL) 5 MG tablet Take 1-2 tablets (5-10 mg total) by mouth 2 (two) times daily as needed for muscle spasms. 02/07/19  Yes Phelps, Erin O, PA-C  lisinopril (ZESTRIL) 10 MG tablet Take 1 tablet (10 mg total) by mouth daily. 10/08/19  Yes Wallis Bamberg, PA-C  erythromycin ophthalmic ointment Place a 1/2 inch ribbon of ointment into the left lower eyelid four times daily. 10/08/19   Wallis Bamberg, PA-C  ibuprofen (ADVIL) 800 MG tablet Take 1 tablet (800 mg total) by mouth 3 (three) times daily. 11/15/19   Darr, Gerilyn Pilgrim, PA-C    Family History Family History  Problem Relation Age of Onset  . Healthy Mother   . Healthy Father     Social History Social History   Tobacco Use  . Smoking status: Current Every Day Smoker    Packs/day: 1.00    Types: Cigarettes  . Smokeless tobacco: Never Used  Substance Use Topics  . Alcohol use: Yes    Comment: margaritas everyday  . Drug use: No     Allergies   Patient has no known allergies.   Review of Systems  Review of Systems  As stated above in HPI Physical Exam Triage Vital Signs ED Triage Vitals  Enc Vitals Group     BP 07/21/20 1621 (S) (!) 181/112     Pulse Rate 07/21/20 1621 (!) 107     Resp 07/21/20 1621 18     Temp 07/21/20 1621 99 F (37.2 C)     Temp Source 07/21/20 1621 Oral     SpO2 07/21/20 1621 97 %     Weight --      Height --      Head Circumference --      Peak Flow --      Pain Score 07/21/20 1624 7     Pain Loc --      Pain Edu? --      Excl. in GC? --    No data found.  Updated Vital Signs BP (S) (!) 181/112 (BP Location: Left Arm) Comment: Pt took BP meds this AM  Pulse (!) 107   Temp 99 F (37.2 C) (Oral)   Resp 18   SpO2 97%   Physical Exam Vitals and nursing note reviewed.  Constitutional:      General: He is  not in acute distress.    Appearance: He is not ill-appearing, toxic-appearing or diaphoretic.  HENT:     Right Ear: Tympanic membrane normal.     Left Ear: Tympanic membrane normal.     Nose: Nose normal. No congestion or rhinorrhea.     Mouth/Throat:     Mouth: Mucous membranes are moist. No angioedema.     Dentition: Dental tenderness, dental caries, dental abscesses and gum lesions present. No gingival swelling.     Tongue: No lesions. Tongue does not deviate from midline.     Palate: No mass and lesions.     Pharynx: Oropharynx is clear. Uvula midline. No pharyngeal swelling, oropharyngeal exudate, posterior oropharyngeal erythema or uvula swelling.     Tonsils: No tonsillar exudate.      Comments: MIP 2 No tenderness or abnormality of superior and inferior palate  Eyes:     General: Lids are normal. Vision grossly intact. Gaze aligned appropriately. No visual field deficit.       Right eye: Discharge present. No foreign body or hordeolum.        Left eye: Discharge present.No foreign body or hordeolum.     Extraocular Movements: Extraocular movements intact.     Right eye: No nystagmus.     Left eye: No nystagmus.     Conjunctiva/sclera:     Right eye: Right conjunctiva is injected. Exudate present. No hemorrhage.    Left eye: Left conjunctiva is injected. Exudate present. No hemorrhage. Neurological:     Mental Status: He is alert.      UC Treatments / Results  Labs (all labs ordered are listed, but only abnormal results are displayed) Labs Reviewed - No data to display  EKG   Radiology No results found.  Procedures Procedures (including critical care time)  Medications Ordered in UC Medications - No data to display  Initial Impression / Assessment and Plan / UC Course  I have reviewed the triage vital signs and the nursing notes.  Pertinent labs & imaging results that were available during my care of the patient were reviewed by me and considered in my  medical decision making (see chart for details).     New x2.  I did discuss with patient my concerns regarding his blood pressure and his eye  symptoms however given his known sick contacts, purulent discharge and improvement with ABX drops we are going to proceed forward with treatment with up-to-date ABX drops.  Should symptoms fail to improve he needs to be assessed by an eye specialist as soon as possible.  I am also going to treat him for dental infection.  I want him to reduce his BC powder use instead switch to the tylenol that I am prescribing for him. We discussed proper use along with risks and black box warning. PMPD checked.  I want him to closely monitor his blood pressure and we did discuss signs and symptoms along with ranges that would require immediate medical attention.  We also discussed red flag symptoms in terms of his eye and dental concerns that would require immediate medical attention. 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    Final Clinical Impressions(s) / UC Diagnoses   Final diagnoses:  None   Discharge Instructions   None    ED Prescriptions    None     PDMP not reviewed this encounter.   Rushie Chestnut, [place] 07/21/20 1720

## 2020-07-21 NOTE — ED Triage Notes (Signed)
Pt reports he has pink eye first in RT eye now has pink eye in lt eye.

## 2020-11-20 ENCOUNTER — Encounter (HOSPITAL_COMMUNITY): Payer: Self-pay | Admitting: *Deleted

## 2020-11-20 ENCOUNTER — Ambulatory Visit (HOSPITAL_COMMUNITY)
Admission: EM | Admit: 2020-11-20 | Discharge: 2020-11-20 | Disposition: A | Discharge status: Home / Self Care | Payer: 59 | Attending: Medical Oncology | Admitting: Medical Oncology

## 2020-11-20 ENCOUNTER — Other Ambulatory Visit: Payer: Self-pay

## 2020-11-20 DIAGNOSIS — M79671 Pain in right foot: Secondary | ICD-10-CM | POA: Diagnosis not present

## 2020-11-20 MED ORDER — TRIAMCINOLONE ACETONIDE 0.025 % EX OINT: 1.0000 "application " | TOPICAL_OINTMENT | Freq: Two times a day (BID) | CUTANEOUS | 0 refills | Status: DC

## 2020-11-20 MED ORDER — LISINOPRIL 10 MG PO TABS: 10.0000 mg | ORAL_TABLET | Freq: Every day | ORAL | 0 refills | Status: DC

## 2020-11-20 MED ORDER — NAPROXEN 500 MG PO TABS: 500.0000 mg | ORAL_TABLET | Freq: Two times a day (BID) | ORAL | 0 refills | Status: DC

## 2020-11-20 NOTE — ED Provider Notes (Addendum)
[facility]    CSN: [id number] Arrival date & time: 11/20/20  1107      History   Chief Complaint Chief Complaint  Patient presents with   Foot Pain    HPI Tracy Tate is a 40 y.o. male.   HPI  Foot Pain: Patient states that a few days ago he wore a brand-new pair of shoes that was too small and he reports that he took the sole out of the shoe and wore them without socks.  He states that he was at a birthday party and had to do a lot of walking and he developed significant foot pain which she attributes to the shoes and attire.  He has been using ice and Advil for symptoms with some improvement but he states that the skin is very irritated and he is not able to find any shoes that fit him properly in this area without rubbing on it. HE also requests crutches. He asked for a postop shoe as well as refill of his blood pressure medication and medication to help with the inflammation.  He denies any history of gout and he denies any other injury, trouble with range of motion, fevers or loss of sensation. Also no noted chest pains, SOB or peripheral edema.   Past Medical History:  Diagnosis Date   Back pain    Hypertension    Migraines     There are no problems to display for this patient.   History reviewed. No pertinent surgical history.     Home Medications    Prior to Admission medications   Medication Sig Start Date End Date Taking? Authorizing Provider  acetaminophen-codeine (TYLENOL #3) 300-30 MG tablet Take 1-2 tablets by mouth every 6 (six) hours as needed for severe pain. 07/21/20   Rushie Chestnut, PA-C  amoxicillin-clavulanate (AUGMENTIN) 875-125 MG tablet Take 1 tablet by mouth every 12 (twelve) hours. 07/21/20   Rushie Chestnut, PA-C  cyclobenzaprine (FLEXERIL) 5 MG tablet Take 1-2 tablets (5-10 mg total) by mouth 2 (two) times daily as needed for muscle spasms. 02/07/19   Lurene Shadow, PA-C  erythromycin ophthalmic ointment Place a 1/2 inch  ribbon of ointment into the lower eyelids four times daily. 07/21/20   Rushie Chestnut, PA-C  lisinopril (ZESTRIL) 10 MG tablet Take 1 tablet (10 mg total) by mouth daily. 10/08/19   Wallis Bamberg, PA-C    Family History Family History  Problem Relation Age of Onset   Healthy Mother    Healthy Father     Social History Social History   Tobacco Use   Smoking status: Every Day    Packs/day: 1.00    Pack years: 0.00    Types: Cigarettes   Smokeless tobacco: Never  Substance Use Topics   Alcohol use: Yes    Comment: margaritas everyday   Drug use: No     Allergies   Patient has no known allergies.   Review of Systems Review of Systems  As stated above in HPI Physical Exam Triage Vital Signs ED Triage Vitals  Enc Vitals Group     BP 11/20/20 1157 (S) (!) 185/125     Pulse Rate 11/20/20 1157 75     Resp --      Temp 11/20/20 1157 99.6 F (37.6 C)     Temp src --      SpO2 11/20/20 1157 98 %     Weight --      Height --  Head Circumference --      Peak Flow --      Pain Score 11/20/20 1154 8     Pain Loc --      Pain Edu? --      Excl. in GC? --    No data found.  Updated Vital Signs BP (S) (!) 185/125 Comment: Pt ran out of meds for HTN  Pulse 75   Temp 99.6 F (37.6 C)   SpO2 98%   Physical Exam Vitals and nursing note reviewed.  Constitutional:      General: He is not in acute distress.    Appearance: Normal appearance. He is not ill-appearing, toxic-appearing or diaphoretic.  Cardiovascular:     Rate and Rhythm: Normal rate and regular rhythm.     Pulses: Normal pulses.     Heart sounds: Normal heart sounds.  Pulmonary:     Effort: Pulmonary effort is normal.     Breath sounds: Normal breath sounds.  Musculoskeletal:        General: Swelling (mild) present. No tenderness. Normal range of motion.  Skin:    General: Skin is warm.     Capillary Refill: Capillary refill takes less than 2 seconds.     Findings: Erythema (of the skin of the  right great lateral toe without skin breakdown) present.  Neurological:     General: No focal deficit present.     Mental Status: He is alert and oriented to person, place, and time.     UC Treatments / Results  Labs (all labs ordered are listed, but only abnormal results are displayed) Labs Reviewed - No data to display  EKG   Radiology No results found.  Procedures Procedures (including critical care time)  Medications Ordered in UC Medications - No data to display  Initial Impression / Assessment and Plan / UC Course  I have reviewed the triage vital signs and the nursing notes.  Pertinent labs & imaging results that were available during my care of the patient were reviewed by me and considered in my medical decision making (see chart for details).     New.  Patient is adamant that there is no possibility of gout and given his history we are can proceed forward with treatment for his strain and irritation.  I am going to call in a topical steroid cream for him to use for the skin irritation.  I am going to call in naproxen for him to use as an anti-inflammatory and for pain however we did discuss that this medication can raise his blood pressure.  He states that he ran out of his blood pressure medicine as of today and so we are going to refill this for him and I want him to take this first, checking to make sure that his blood pressure has reduced into the normal range before starting the naproxen medication.  He needs to keep a close eye on his blood pressure while on the naproxen medication.  He is agreeable.  He declines BP recheck today in office.  We discussed red flag signs and symptoms. Final Clinical Impressions(s) / UC Diagnoses   Final diagnoses:  None   Discharge Instructions   None    ED Prescriptions   None    PDMP not reviewed this encounter.   Rushie Chestnut, PA-C 11/20/20 1229    Rushie Chestnut, PA-C 11/20/20 1231

## 2020-11-20 NOTE — ED Triage Notes (Signed)
Pt reports he thinks some shoes caused  pain th medial aspect of Lt great toe.

## 2021-12-25 ENCOUNTER — Ambulatory Visit (HOSPITAL_COMMUNITY)
Admission: EM | Admit: 2021-12-25 | Discharge: 2021-12-25 | Disposition: A | Discharge status: Home / Self Care | Payer: 59 | Attending: Family Medicine | Admitting: Family Medicine

## 2021-12-25 ENCOUNTER — Other Ambulatory Visit: Payer: Self-pay

## 2021-12-25 ENCOUNTER — Encounter (HOSPITAL_COMMUNITY): Payer: Self-pay | Admitting: Emergency Medicine

## 2021-12-25 ENCOUNTER — Ambulatory Visit (INDEPENDENT_AMBULATORY_CARE_PROVIDER_SITE_OTHER): Payer: 59

## 2021-12-25 DIAGNOSIS — K122 Cellulitis and abscess of mouth: Secondary | ICD-10-CM

## 2021-12-25 DIAGNOSIS — M25521 Pain in right elbow: Secondary | ICD-10-CM

## 2021-12-25 DIAGNOSIS — I10 Essential (primary) hypertension: Secondary | ICD-10-CM | POA: Diagnosis not present

## 2021-12-25 MED ORDER — LISINOPRIL 10 MG PO TABS: 10.0000 mg | ORAL_TABLET | Freq: Every day | ORAL | 0 refills | Status: DC

## 2021-12-25 MED ORDER — IBUPROFEN 800 MG PO TABS: 800.0000 mg | ORAL_TABLET | Freq: Three times a day (TID) | ORAL | 0 refills | Status: AC | PRN

## 2021-12-25 MED ORDER — AMOXICILLIN 875 MG PO TABS: 875.0000 mg | ORAL_TABLET | Freq: Two times a day (BID) | ORAL | 0 refills | Status: AC

## 2021-12-25 NOTE — Discharge Instructions (Signed)
   Take amoxicillin 875 mg--1 tab twice daily for 7 days  Take ibuprofen 800 mg--1 tab every 8 hours as needed for pain.  Take your lisinopril 10 mg--1 daily  Use the website/QR code at the end of your summary paperwork to establish a new patient appointment with a new primary care office.  He should be seeing a primary care routinely 2 or 3 times a year about your hypertension.

## 2021-12-25 NOTE — ED Provider Notes (Signed)
[facility]    CSN: [id number] Arrival date & time: 12/25/21  1044      History   Chief Complaint Chief Complaint  Patient presents with   Elbow Problem    HPI Tracy Tate is a 41 y.o. male.   HPI Here for right elbow pain and popping.  Yesterday he had worked out at [employer], and then went and shot basketball when he brought his arm down after shooting he felt a pop in his right elbow and then he has had popping and pain in it.  And he cannot fully extend at the right elbow.  No fall onto it or trauma  He also has pain in his right posterior upper cheek, near where he had dental extractions done about 6 or 7 months ago.  He thinks it is an infection.  He has not followed up with the dentist.  He has not seen primary care about his hypertension recently, and request that I lower his lisinopril dose.  He states he feels dizzy after he takes it.  He has not checked his blood pressure when he is feeling dizzy to confirm that is too low.  Today blood pressure on recheck is 167/110 which is improved from initially when it in the 170s/ 120  Past Medical History:  Diagnosis Date   Back pain    Hypertension    Migraines     There are no problems to display for this patient.   History reviewed. No pertinent surgical history.     Home Medications    Prior to Admission medications   Medication Sig Start Date End Date Taking? Authorizing Provider  amoxicillin (AMOXIL) 875 MG tablet Take 1 tablet (875 mg total) by mouth 2 (two) times daily for 7 days. 12/25/21 01/01/22 Yes Alexas Basulto, Janace Aris, MD  ibuprofen (ADVIL) 800 MG tablet Take 1 tablet (800 mg total) by mouth every 8 (eight) hours as needed (pain). 12/25/21  Yes Zenia Resides, MD  lisinopril (ZESTRIL) 10 MG tablet Take 1 tablet (10 mg total) by mouth daily. 12/25/21   Zenia Resides, MD    Family History Family History  Problem Relation Age of Onset   Healthy Mother    Healthy Father     Social  History Social History   Tobacco Use   Smoking status: Every Day    Packs/day: 1.00    Types: Cigarettes   Smokeless tobacco: Never  Vaping Use   Vaping Use: Never used  Substance Use Topics   Alcohol use: Yes    Comment: margaritas everyday   Drug use: Yes    Types: Marijuana     Allergies   Patient has no known allergies.   Review of Systems Review of Systems   Physical Exam Triage Vital Signs ED Triage Vitals  Enc Vitals Group     BP 12/25/21 1128 (!) 178/121     Pulse Rate 12/25/21 1128 84     Resp 12/25/21 1128 20     Temp 12/25/21 1128 98.1 F (36.7 C)     Temp Source 12/25/21 1128 Oral     SpO2 12/25/21 1128 96 %     Weight --      Height --      Head Circumference --      Peak Flow --      Pain Score 12/25/21 1126 7     Pain Loc --      Pain Edu? --  Excl. in GC? --    No data found.  Updated Vital Signs BP (!) 167/110 (BP Location: Left Arm) Comment (BP Location): large cuff, repositioned  Pulse 84   Temp 98.1 F (36.7 C) (Oral)   Resp 20   SpO2 96%   Visual Acuity Right Eye Distance:   Left Eye Distance:   Bilateral Distance:    Right Eye Near:   Left Eye Near:    Bilateral Near:     Physical Exam Vitals reviewed.  Constitutional:      General: He is not in acute distress.    Appearance: He is not ill-appearing, toxic-appearing or diaphoretic.  HENT:     Nose: Nose normal.     Mouth/Throat:     Mouth: Mucous membranes are moist.     Pharynx: No oropharyngeal exudate or posterior oropharyngeal erythema.     Comments: There is possible mild swelling around his right upper posterior dental ridge Eyes:     Extraocular Movements: Extraocular movements intact.     Conjunctiva/sclera: Conjunctivae normal.     Pupils: Pupils are equal, round, and reactive to light.  Cardiovascular:     Rate and Rhythm: Normal rate and regular rhythm.     Heart sounds: No murmur heard. Pulmonary:     Effort: Pulmonary effort is normal.      Breath sounds: Normal breath sounds.  Musculoskeletal:     Cervical back: Neck supple.     Comments: Range of motion is limited active or passive on the right elbow.  No edema or deformity noted.  Lymphadenopathy:     Cervical: No cervical adenopathy.  Skin:    Coloration: Skin is not jaundiced or pale.  Neurological:     General: No focal deficit present.     Mental Status: He is alert and oriented to person, place, and time.  Psychiatric:        Behavior: Behavior normal.      UC Treatments / Results  Labs (all labs ordered are listed, but only abnormal results are displayed) Labs Reviewed - No data to display  EKG   Radiology DG Elbow Complete Right  Result Date: 12/25/2021 CLINICAL DATA:  41 year old male with history of right-sided elbow pain and limited range of motion. No priors. EXAM: RIGHT ELBOW - COMPLETE 3+ VIEW COMPARISON:  None Available. FINDINGS: There is no evidence of fracture, dislocation, or joint effusion. There is no evidence of arthropathy or other focal bone abnormality. Soft tissues are unremarkable. IMPRESSION: Negative. Electronically Signed   By: Trudie Reed M.D.   On: 12/25/2021 11:59    Procedures Procedures (including critical care time)  Medications Ordered in UC Medications - No data to display  Initial Impression / Assessment and Plan / UC Course  I have reviewed the triage vital signs and the nursing notes.  Pertinent labs & imaging results that were available during my care of the patient were reviewed by me and considered in my medical decision making (see chart for details).     X-ray is negative.  He is given contact information for orthopedics, as he may have a soft tissue injury causing his symptoms.  I have also given him instructions on how to self schedule a primary care appointment, and have asked him to follow-up with dental.  We will treat with pain medication and amoxicillin for possible dental infection, his  lisinopril is refilled. Final Clinical Impressions(s) / UC Diagnoses   Final diagnoses:  Right elbow pain  Oral infection  Essential hypertension     Discharge Instructions        Take amoxicillin 875 mg--1 tab twice daily for 7 days  Take ibuprofen 800 mg--1 tab every 8 hours as needed for pain.  Take your lisinopril 10 mg--1 daily  Use the website/QR code at the end of your summary paperwork to establish a new patient appointment with a new primary care office.  He should be seeing a primary care routinely 2 or 3 times a year about your hypertension.    ED Prescriptions     Medication Sig Dispense Auth. Provider   lisinopril (ZESTRIL) 10 MG tablet Take 1 tablet (10 mg total) by mouth daily. 90 tablet Amish Mintzer, Janace Aris, MD   ibuprofen (ADVIL) 800 MG tablet Take 1 tablet (800 mg total) by mouth every 8 (eight) hours as needed (pain). 21 tablet Tanja Gift, Janace Aris, MD   amoxicillin (AMOXIL) 875 MG tablet Take 1 tablet (875 mg total) by mouth 2 (two) times daily for 7 days. 14 tablet Dazha Kempa, Janace Aris, MD      I have reviewed the PDMP during this encounter.   Zenia Resides, MD 12/25/21 1218

## 2021-12-25 NOTE — ED Triage Notes (Addendum)
Right elbow pain 3-4 days ago.  Patient was shooting a basketball and felt pain in right elbow.  Patient is able to move right elbow, occasionally pops Has taken ibuprofen.     Facial pain for 2-3 weeks.  Patient had teeth pulled and did not return for follow up visit.

## 2024-01-12 ENCOUNTER — Encounter (HOSPITAL_COMMUNITY): Payer: Self-pay

## 2024-01-12 ENCOUNTER — Ambulatory Visit (HOSPITAL_COMMUNITY)
Admission: EM | Admit: 2024-01-12 | Discharge: 2024-01-12 | Disposition: A | Discharge status: Home / Self Care | Attending: Emergency Medicine | Admitting: Emergency Medicine

## 2024-01-12 DIAGNOSIS — M25522 Pain in left elbow: Secondary | ICD-10-CM

## 2024-01-12 DIAGNOSIS — I10 Essential (primary) hypertension: Secondary | ICD-10-CM | POA: Diagnosis not present

## 2024-01-12 DIAGNOSIS — M25422 Effusion, left elbow: Secondary | ICD-10-CM

## 2024-01-12 MED ORDER — PREDNISONE 20 MG PO TABS: 40.0000 mg | ORAL_TABLET | Freq: Every day | ORAL | 0 refills | Status: AC

## 2024-01-12 MED ORDER — LISINOPRIL 10 MG PO TABS: 10.0000 mg | ORAL_TABLET | Freq: Every day | ORAL | 0 refills | Status: AC

## 2024-01-12 MED ORDER — KETOROLAC TROMETHAMINE 30 MG/ML IJ SOLN
30.0000 mg | Freq: Once | INTRAMUSCULAR | Status: AC
  Administered 2024-01-12: 30 mg via INTRAMUSCULAR

## 2024-01-12 MED ORDER — KETOROLAC TROMETHAMINE 30 MG/ML IJ SOLN
INTRAMUSCULAR | Status: AC
  Filled 2024-01-12: qty 1

## 2024-01-12 NOTE — Discharge Instructions (Addendum)
 You were given an injection of Toradol  in clinic today to help with your pain. Take 2 tablets of prednisone  once daily for 3 days to help decrease inflammation of your elbow. I also recommend applying ice, rest, and elevate your arm throughout the day. You can alternate between 650 mg of Tylenol  and 400 to 600 mg of ibuprofen  every 6-8 hours as needed for pain. Follow-up with [facility] for further evaluation and management of this if needed.  I have refilled your blood pressure medication.  Please start taking 1 tablet daily to help manage this.  I have given you a 90-day supply of this. Follow-up with the primary care provider that you are established with today for further evaluation management of your blood pressure.  Return here as needed.

## 2024-01-12 NOTE — ED Provider Notes (Signed)
 [facility]    CSN: [id number] Arrival date & time: 01/12/24  1003      History   Chief Complaint Chief Complaint  Patient presents with   Joint Swelling    Elbow   Medication Refill    HPI Tracy Tate is a 43 y.o. male.   Patient presents with left elbow pain and swelling x 2 days.  Patient states that he previously injured this while playing basketball last year and had pain and swelling at that time, but reports there was no fracture or dislocation at that time.  Patient states that he never did follow-up with an orthopedic doctor for this.   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  Patient denies any falls or known injury.  Patient denies taking any medication for his pain.  Patient states that he has been applying ice with some relief of swelling.  Patient states that he does not believe that he needs an x-ray and reports that he cannot afford one at this time even if he did.  Patient denies history of gout.  Patient also does report a history of hypertension.  Patient's blood pressure is elevated in clinic today.  Patient states that he has not been taking his previously prescribed lisinopril  and has been drinking beets daily which he thought was helping with his blood pressure.  Patient states that he does not have a primary care provider at this time.  Patient denies any symptoms related to his blood pressure being elevated.    The history is provided by the patient and medical records.  Medication Refill   Past Medical History:  Diagnosis Date   Back pain    Hypertension    Migraines     There are no active problems to display for this patient.   History reviewed. No pertinent surgical history.     Home Medications    Prior to Admission medications   Medication Sig Start Date End Date Taking? Authorizing Provider  predniSONE  (DELTASONE ) 20 MG tablet Take 2  tablets (40 mg total) by mouth daily for 3 days. 01/12/24 01/15/24 Yes Johnie, Jamontae Thwaites A, NP  ibuprofen  (ADVIL ) 800 MG tablet Take 1 tablet (800 mg total) by mouth every 8 (eight) hours as needed (pain). 12/25/21   Vonna Sharlet POUR, MD  lisinopril  (ZESTRIL ) 10 MG tablet Take 1 tablet (10 mg total) by mouth daily. 01/12/24   Johnie Rumaldo LABOR, NP    Family History Family History  Problem Relation Age of Onset   Healthy Mother    Healthy Father     Social History Social History   Tobacco Use   Smoking status: Former    Current packs/day: 1.00    Types: Cigarettes   Smokeless tobacco: Never  Vaping Use   Vaping status: Never Used  Substance Use Topics   Alcohol use: Yes    Comment: margaritas everyday   Drug use: Not Currently    Types: Marijuana     Allergies   Patient has no known allergies.   Review of Systems Review of Systems  Per HPI  Physical Exam Triage Vital Signs ED Triage Vitals  Encounter Vitals Group     BP 01/12/24 1020 (!) 193/139     Girls Systolic BP Percentile --      Girls Diastolic BP Percentile --      Boys Systolic BP Percentile --  Boys Diastolic BP Percentile --      Pulse Rate 01/12/24 1020 92     Resp 01/12/24 1020 16     Temp 01/12/24 1020 99.5 F (37.5 C)     Temp Source 01/12/24 1020 Oral     SpO2 01/12/24 1020 95 %     Weight --      Height --      Head Circumference --      Peak Flow --      Pain Score 01/12/24 1022 8     Pain Loc --      Pain Education --      Exclude from Growth Chart --    No data found.  Updated Vital Signs BP (!) 193/139 (BP Location: Right Arm)   Pulse 92   Temp 99.5 F (37.5 C) (Oral)   Resp 16   SpO2 95%   Visual Acuity Right Eye Distance:   Left Eye Distance:   Bilateral Distance:    Right Eye Near:   Left Eye Near:    Bilateral Near:     Physical Exam Vitals and nursing note reviewed.  Constitutional:      General: He is awake. He is not in acute distress.    Appearance:  Normal appearance. He is well-developed and well-groomed. He is not ill-appearing.  Musculoskeletal:     Left elbow: Swelling present. No deformity. Decreased range of motion. Tenderness present in lateral epicondyle.     Comments: Tenderness and swelling noted over the lateral epicondyle.  Mildly decreased range of motion secondary to pain.  Skin:    General: Skin is warm and dry.  Neurological:     General: No focal deficit present.     Mental Status: He is alert and oriented to person, place, and time. Mental status is at baseline.     GCS: GCS eye subscore is 4. GCS verbal subscore is 5. GCS motor subscore is 6.     Cranial Nerves: Cranial nerves 2-12 are intact.     Sensory: Sensation is intact.     Motor: Motor function is intact.     Coordination: Coordination is intact.     Gait: Gait is intact.  Psychiatric:        Behavior: Behavior is cooperative.      UC Treatments / Results  Labs (all labs ordered are listed, but only abnormal results are displayed) Labs Reviewed - No data to display  EKG   Radiology No results found.  Procedures Procedures (including critical care time)  Medications Ordered in UC Medications  ketorolac  (TORADOL ) 30 MG/ML injection 30 mg (has no administration in time range)    Initial Impression / Assessment and Plan / UC Course  I have reviewed the triage vital signs and the nursing notes.  Pertinent labs & imaging results that were available during my care of the patient were reviewed by me and considered in my medical decision making (see chart for details).     Patient is overall well-appearing.  Vitals are overall stable, blood pressure is elevated at 193/139.  Given IM Toradol  in clinic for acute pain.  Prescribed short course of prednisone  for inflammation related to elbow pain and swelling.  Recommended RICE therapy.  Given orthopedic follow-up.  Refilled lisinopril .  Established patient with a primary care provider in clinic  today.  Discussed follow-up and return precautions. Final Clinical Impressions(s) / UC Diagnoses   Final diagnoses:  Pain and swelling of left elbow  Essential  hypertension     Discharge Instructions      You were given an injection of Toradol  in clinic today to help with your pain. Take 2 tablets of prednisone  once daily for 3 days to help decrease inflammation of your elbow. I also recommend applying ice, rest, and elevate your arm throughout the day. You can alternate between 650 mg of Tylenol  and 400 to 600 mg of ibuprofen  every 6-8 hours as needed for pain. Follow-up with [facility] for further evaluation and management of this if needed.  I have refilled your blood pressure medication.  Please start taking 1 tablet daily to help manage this.  I have given you a 90-day supply of this. Follow-up with the primary care provider that you are established with today for further evaluation management of your blood pressure.  Return here as needed.   ED Prescriptions     Medication Sig Dispense Auth. Provider   predniSONE  (DELTASONE ) 20 MG tablet Take 2 tablets (40 mg total) by mouth daily for 3 days. 6 tablet Johnie Flaming A, NP   lisinopril  (ZESTRIL ) 10 MG tablet Take 1 tablet (10 mg total) by mouth daily. 90 tablet Johnie Flaming A, NP      PDMP not reviewed this encounter.   Johnie Flaming A, NP 01/12/24 1101

## 2024-01-12 NOTE — ED Triage Notes (Signed)
 Patient here today with c/o left elbow pain and swelling. Patient states that he previously injured it while playing basketball last year and on Thursday he hit it on something.   Patient states that he also needs a refill on his BP medication and has been out for a few days.

## 2024-01-27 ENCOUNTER — Other Ambulatory Visit: Payer: Self-pay

## 2024-01-27 ENCOUNTER — Encounter (HOSPITAL_COMMUNITY): Payer: Self-pay | Admitting: *Deleted

## 2024-01-27 ENCOUNTER — Ambulatory Visit (HOSPITAL_COMMUNITY)
Admission: EM | Admit: 2024-01-27 | Discharge: 2024-01-27 | Disposition: A | Discharge status: Home / Self Care | Attending: Physician Assistant | Admitting: Physician Assistant

## 2024-01-27 DIAGNOSIS — M778 Other enthesopathies, not elsewhere classified: Secondary | ICD-10-CM

## 2024-01-27 MED ORDER — PREDNISONE 50 MG PO TABS: ORAL_TABLET | ORAL | 0 refills | Status: AC

## 2024-01-27 NOTE — ED Provider Notes (Signed)
 [facility]    CSN: [id number] Arrival date & time: 01/27/24  1511      History   Chief Complaint Chief Complaint  Patient presents with   LT thumb pain    HPI Tracy Tate is a 43 y.o. male.   Pt complains of pain in his left hand.  Pt does repetive work.  Pt denies any known injury.  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  He does not recall any injury.  Patient does not feel like he has anything broken.  Patient reports that he had a similar episode in his elbow.  Patient reports he had good relief with prednisone .  Patient is requesting treatment with prednisone .  Patient denies any fever or chills.  He denies any injury  The history is provided by the patient. No language interpreter was used.    Past Medical History:  Diagnosis Date   Back pain    Hypertension    Migraines     There are no active problems to display for this patient.   History reviewed. No pertinent surgical history.     Home Medications    Prior to Admission medications   Medication Sig Start Date End Date Taking? Authorizing Provider  ibuprofen  (ADVIL ) 800 MG tablet Take 1 tablet (800 mg total) by mouth every 8 (eight) hours as needed (pain). 12/25/21  Yes Banister, Sharlet POUR, MD  lisinopril  (ZESTRIL ) 10 MG tablet Take 1 tablet (10 mg total) by mouth daily. 01/12/24  Yes Johnie Flaming A, NP  predniSONE  (DELTASONE ) 50 MG tablet One tablet a day 01/27/24  Yes Flint Sonny POUR, PA-C    Family History Family History  Problem Relation Age of Onset   Healthy Mother    Healthy Father     Social History Social History   Tobacco Use   Smoking status: Former    Current packs/day: 1.00    Types: Cigarettes   Smokeless tobacco: Never  Vaping Use   Vaping status: Never Used  Substance Use Topics   Alcohol use: Yes    Comment: margaritas everyday   Drug use: Not Currently    Types: Marijuana     Allergies   Patient has no known  allergies.   Review of Systems Review of Systems  All other systems reviewed and are negative.    Physical Exam Triage Vital Signs ED Triage Vitals  Encounter Vitals Group     BP 01/27/24 1533 (!) 187/129     Girls Systolic BP Percentile --      Girls Diastolic BP Percentile --      Boys Systolic BP Percentile --      Boys Diastolic BP Percentile --      Pulse Rate 01/27/24 1533 82     Resp 01/27/24 1533 20     Temp 01/27/24 1533 98.2 F (36.8 C)     Temp src --      SpO2 01/27/24 1533 97 %     Weight --      Height --      Head Circumference --      Peak Flow --      Pain Score 01/27/24 1532 7     Pain Loc --      Pain Education --      Exclude from Growth Chart --    No data found.  Updated Vital Signs BP (!) 187/129   Pulse 82   Temp 98.2  F (36.8 C)   Resp 20   SpO2 97%   Visual Acuity Right Eye Distance:   Left Eye Distance:   Bilateral Distance:    Right Eye Near:   Left Eye Near:    Bilateral Near:     Physical Exam Vitals and nursing note reviewed.  Constitutional:      Appearance: He is well-developed.  HENT:     Head: Normocephalic.  Cardiovascular:     Rate and Rhythm: Normal rate.  Pulmonary:     Effort: Pulmonary effort is normal.  Abdominal:     General: There is no distension.  Musculoskeletal:        General: Swelling and tenderness present.     Comments: Swollen left wrist, full range of motion with pain neurovascular neurosensory intact tender distal radial area.  Skin:    General: Skin is warm.  Neurological:     General: No focal deficit present.     Mental Status: He is alert and oriented to person, place, and time.   I was yeah I   UC Treatments / Results  Labs (all labs ordered are listed, but only abnormal results are displayed) Labs Reviewed - No data to display  EKG   Radiology No results found.  Procedures Procedures (including critical care time)  Medications Ordered in UC Medications - No data to  display  Initial Impression / Assessment and Plan / UC Course  I have reviewed the triage vital signs and the nursing notes.  Pertinent labs & imaging results that were available during my care of the patient were reviewed by me and considered in my medical decision making (see chart for details).     Patient most likely has tendinitis to his left wrist.  Patient is given the phone number for Dr. Josefina to follow-up he is placed in a thumb spica splint.  Patient advised to limit use.  Rx for prednisone . An After Visit Summary was printed and given to the patient.     Final Clinical Impressions(s) / UC Diagnoses   Final diagnoses:  Left wrist tendonitis     Discharge Instructions      Return if any problems.     ED Prescriptions     Medication Sig Dispense Auth. Provider   predniSONE  (DELTASONE ) 50 MG tablet One tablet a day 5 tablet Glendola Friedhoff K, PA-C      PDMP not reviewed this encounter.   Flint Sonny POUR, PA-C 01/27/24 1801

## 2024-01-27 NOTE — ED Triage Notes (Signed)
 PT reports he has Lt thumb pain . Pt reports it feels like a pinched nerve. Pt took tylenol  with some relief of pain. PT also has Lt wrist pain.

## 2024-01-27 NOTE — Discharge Instructions (Signed)
 Return if any problems.

## 2024-03-06 ENCOUNTER — Ambulatory Visit: Admitting: Family Medicine
